# Patient Record
Sex: Male | Born: 1955
Health system: Southern US, Community
[De-identification: ages and names within clinical notes are randomized; demographics above are authoritative.]

## PROBLEM LIST (undated history)

## (undated) DIAGNOSIS — I10 Essential (primary) hypertension: Secondary | ICD-10-CM

## (undated) DIAGNOSIS — E785 Hyperlipidemia, unspecified: Secondary | ICD-10-CM

## (undated) DIAGNOSIS — I499 Cardiac arrhythmia, unspecified: Secondary | ICD-10-CM

## (undated) DIAGNOSIS — E119 Type 2 diabetes mellitus without complications: Secondary | ICD-10-CM

## (undated) HISTORY — PX: TONSILLECTOMY: SUR1361

## (undated) HISTORY — DX: Essential (primary) hypertension: I10

## (undated) HISTORY — PX: APPENDECTOMY: SHX54

## (undated) HISTORY — PX: HERNIA REPAIR: SHX51

## (undated) HISTORY — DX: Type 2 diabetes mellitus without complications: E11.9

## (undated) HISTORY — PX: KNEE SURGERY: SHX244

## (undated) HISTORY — DX: Hyperlipidemia, unspecified: E78.5

---

## 2004-12-03 ENCOUNTER — Emergency Department: Payer: Self-pay | Admitting: Emergency Medicine

## 2005-04-01 HISTORY — PX: COLONOSCOPY: SHX174

## 2007-06-17 ENCOUNTER — Ambulatory Visit: Payer: Self-pay | Admitting: Gastroenterology

## 2007-06-17 LAB — HM COLONOSCOPY

## 2009-06-05 ENCOUNTER — Ambulatory Visit: Payer: Self-pay | Admitting: Surgery

## 2011-08-04 ENCOUNTER — Ambulatory Visit: Payer: Self-pay

## 2011-08-04 LAB — CBC WITH DIFFERENTIAL/PLATELET
Basophil %: 0.9 %
Eosinophil %: 2.1 %
HCT: 46 % (ref 40.0–52.0)
HGB: 15.4 g/dL (ref 13.0–18.0)
Lymphocyte %: 42.6 %
MCHC: 33.5 g/dL (ref 32.0–36.0)
MCV: 97 fL (ref 80–100)
Neutrophil #: 3.5 10*3/uL (ref 1.4–6.5)
Neutrophil %: 44.9 %
RDW: 12.6 % (ref 11.5–14.5)

## 2011-08-04 LAB — SEDIMENTATION RATE: Erythrocyte Sed Rate: 3 mm/hr (ref 0–20)

## 2011-08-04 LAB — URIC ACID: Uric Acid: 6.1 mg/dL (ref 3.5–7.2)

## 2011-08-22 ENCOUNTER — Other Ambulatory Visit: Payer: Self-pay | Admitting: Podiatry

## 2011-08-22 LAB — SYNOVIAL FLUID, CRYSTAL: Crystals, Joint Fluid: NONE SEEN

## 2011-08-22 LAB — BODY FLUID CELL COUNT WITH DIFFERENTIAL
Basophil: 0 %
Eosinophil: 0 %
Lymphocytes: 33 %
Neutrophils: 24 %
Nucleated Cell Count: 21 /mm3
Other Cells BF: 14 %
Other Mononuclear Cells: 29 %

## 2011-10-18 ENCOUNTER — Observation Stay: Payer: Self-pay | Admitting: Internal Medicine

## 2011-10-18 LAB — URINALYSIS, COMPLETE
Glucose,UR: NEGATIVE mg/dL (ref 0–75)
Ketone: NEGATIVE
Leukocyte Esterase: NEGATIVE
Nitrite: NEGATIVE
Ph: 5 (ref 4.5–8.0)
Protein: NEGATIVE
RBC,UR: 6 /HPF (ref 0–5)
Specific Gravity: 1.018 (ref 1.003–1.030)
Squamous Epithelial: NONE SEEN
WBC UR: 1 /HPF (ref 0–5)

## 2011-10-18 LAB — COMPREHENSIVE METABOLIC PANEL
Albumin: 3.5 g/dL (ref 3.4–5.0)
Anion Gap: 10 (ref 7–16)
Bilirubin,Total: 1 mg/dL (ref 0.2–1.0)
Calcium, Total: 8.4 mg/dL — ABNORMAL LOW (ref 8.5–10.1)
Chloride: 109 mmol/L — ABNORMAL HIGH (ref 98–107)
Co2: 23 mmol/L (ref 21–32)
Creatinine: 0.85 mg/dL (ref 0.60–1.30)
EGFR (African American): >60
Glucose: 106 mg/dL — ABNORMAL HIGH (ref 65–99)
Osmolality: 285 (ref 275–301)
Potassium: 3.8 mmol/L (ref 3.5–5.1)
SGOT(AST): 23 U/L (ref 15–37)
Sodium: 142 mmol/L (ref 136–145)
Total Protein: 6.7 g/dL (ref 6.4–8.2)

## 2011-10-18 LAB — CBC
MCH: 30.8 pg (ref 26.0–34.0)
MCHC: 31.8 g/dL — ABNORMAL LOW (ref 32.0–36.0)
Platelet: 245 10*3/uL (ref 150–440)
RBC: 4.42 10*6/uL (ref 4.40–5.90)
RDW: 13.2 % (ref 11.5–14.5)

## 2011-10-18 LAB — LIPID PANEL
Ldl Cholesterol, Calc: 77 mg/dL (ref 0–100)
VLDL Cholesterol, Calc: 31 mg/dL (ref 5–40)

## 2011-10-19 DIAGNOSIS — G459 Transient cerebral ischemic attack, unspecified: Secondary | ICD-10-CM

## 2011-10-19 LAB — COMPREHENSIVE METABOLIC PANEL
Alkaline Phosphatase: 46 U/L — ABNORMAL LOW (ref 50–136)
Bilirubin,Total: 1.1 mg/dL — ABNORMAL HIGH (ref 0.2–1.0)
Calcium, Total: 8.5 mg/dL (ref 8.5–10.1)
Chloride: 110 mmol/L — ABNORMAL HIGH (ref 98–107)
Co2: 25 mmol/L (ref 21–32)
Creatinine: 0.81 mg/dL (ref 0.60–1.30)
EGFR (African American): >60
EGFR (Non-African Amer.): >60
Glucose: 104 mg/dL — ABNORMAL HIGH (ref 65–99)
Osmolality: 290 (ref 275–301)
Potassium: 3.7 mmol/L (ref 3.5–5.1)
SGOT(AST): 11 U/L — ABNORMAL LOW (ref 15–37)
Sodium: 145 mmol/L (ref 136–145)

## 2011-10-19 LAB — CK-MB: CK-MB: 3.2 ng/mL (ref 0.5–3.6)

## 2011-10-19 LAB — CBC WITH DIFFERENTIAL/PLATELET
Eosinophil %: 3.2 %
HCT: 41 % (ref 40.0–52.0)
HGB: 13.7 g/dL (ref 13.0–18.0)
Lymphocyte #: 3.3 10*3/uL (ref 1.0–3.6)
Lymphocyte %: 47.4 %
MCH: 32.1 pg (ref 26.0–34.0)
MCV: 96 fL (ref 80–100)
Monocyte %: 7.1 %
Neutrophil #: 2.8 10*3/uL (ref 1.4–6.5)
RBC: 4.26 10*6/uL — ABNORMAL LOW (ref 4.40–5.90)
RDW: 13 % (ref 11.5–14.5)
WBC: 7 10*3/uL (ref 3.8–10.6)

## 2011-10-19 LAB — TROPONIN I
Troponin-I: <0.02 ng/mL
Troponin-I: <0.02 ng/mL
Troponin-I: <0.02 ng/mL

## 2013-06-30 DIAGNOSIS — M1710 Unilateral primary osteoarthritis, unspecified knee: Secondary | ICD-10-CM | POA: Insufficient documentation

## 2013-07-05 ENCOUNTER — Ambulatory Visit: Payer: Self-pay

## 2014-04-21 LAB — HEPATIC FUNCTION PANEL
ALT: 28 U/L (ref 10–40)
AST: 22 U/L (ref 14–40)

## 2014-04-21 LAB — PSA: PSA: 0.4

## 2014-04-21 LAB — LIPID PANEL
CHOLESTEROL: 161 mg/dL (ref 0–200)
HDL: 44 mg/dL (ref 35–70)
LDL CALC: 91 mg/dL
Triglycerides: 128 mg/dL (ref 40–160)

## 2014-04-21 LAB — BASIC METABOLIC PANEL
BUN: 19 mg/dL (ref 4–21)
Creatinine: 1 mg/dL (ref <=1.3)
Glucose: 122 mg/dL

## 2014-04-21 LAB — FECAL OCCULT BLOOD, GUAIAC: Fecal Occult Blood: NEGATIVE

## 2014-05-19 DIAGNOSIS — M1712 Unilateral primary osteoarthritis, left knee: Secondary | ICD-10-CM | POA: Insufficient documentation

## 2014-05-25 DIAGNOSIS — E785 Hyperlipidemia, unspecified: Secondary | ICD-10-CM | POA: Insufficient documentation

## 2014-05-25 DIAGNOSIS — I10 Essential (primary) hypertension: Secondary | ICD-10-CM | POA: Insufficient documentation

## 2014-05-25 DIAGNOSIS — E669 Obesity, unspecified: Secondary | ICD-10-CM | POA: Insufficient documentation

## 2014-05-25 DIAGNOSIS — E1169 Type 2 diabetes mellitus with other specified complication: Secondary | ICD-10-CM | POA: Insufficient documentation

## 2014-05-25 DIAGNOSIS — R9439 Abnormal result of other cardiovascular function study: Secondary | ICD-10-CM | POA: Insufficient documentation

## 2014-05-25 DIAGNOSIS — E119 Type 2 diabetes mellitus without complications: Secondary | ICD-10-CM | POA: Insufficient documentation

## 2014-06-10 LAB — HEMOGLOBIN A1C: Hgb A1c MFr Bld: 7.4 % — AB (ref 4.0–6.0)

## 2014-07-01 DIAGNOSIS — Z96652 Presence of left artificial knee joint: Secondary | ICD-10-CM | POA: Insufficient documentation

## 2014-07-19 NOTE — H&P (Signed)
PATIENT NAME:  Darius Brown, HENDERSHOTT MR#:  [id number] DATE OF BIRTH:  22-Mar-1956  DATE OF ADMISSION:  10/18/2011  PRIMARY CARE PHYSICIAN: Dr. Otilio Miu.   CHIEF COMPLAINT: Right-sided numbness and tingling.   HISTORY OF PRESENT ILLNESS:  This is a morbidly obese 59 year old male with a history of hypertension, dyslipidemia, type 2 diabetes who presents to the ER with a chief complaint of sudden onset of right upper extremity numbness and tingling that started around 5:30 p.m. The patient had just driven back from [place] after a long meeting. The patient states that he was in his garage trying to prepare the meal for the dog when he started experiencing these symptoms. The symptom was associated with blurry vision and headache. The patient denied any slurred speech or unilateral weakness. He denied any difficulty with ambulation or gait instability. His symptoms lasted only about 2 to 3 minutes. The patient was brought in via EMS. He has never experienced anything like this before. There is no prior history of stroke or cerebrovascular accident. The patient states that his last hemoglobin A1c was 6.9. He has been compliant with his medications. His wife attributes the symptoms to a stressful job. He denies any chest pain or palpitations. He denies any orthopnea, paroxysmal nocturnal dyspnea, or dependent edema. Currently, has no gross focal neurologic deficits when he presented to the ER. The patient was found to be normotensive. He had a negative work-up, but is being admitted for observation. He does not take any aspirin. He denies any weight loss or weight gain. He denies any cervical spine pain or discomfort.   REVIEW OF SYSTEMS: CONSTITUTIONAL: Denies any fever, fatigue, weakness, pain, weight loss or weight gain. EYES: Does complain of blurry vision, but denies any pain, redness, inflammation or glaucoma. ENT: Denies any tinnitus, ear pain, hearing loss, or seasonal allergies. RESPIRATORY: Denies any  cough, wheezing, hemoptysis, or dyspnea. CARDIOVASCULAR: Denies any chest pain, orthopnea, edema, or arrhythmia. GASTROINTESTINAL: Denies any nausea, vomiting, abdominal pain, hematemesis, or melena.  GENITOURINARY: Denies any hematuria, dysuria, renal calculi, or frequency. ENDOCRINE: Denies any polyuria, nocturia, thyroid problems, increased sweating, heat or cold intolerance. HEMATOLOGIC: Denies any anemia, easy bruising, bleeding, or swollen glands. INTEGUMENTARY: Denies any acne, rash, or lesions. MUSCULOSKELETAL: Denies any neck, back pain, shoulder pain or knee pain. NEUROLOGICAL: Denies any numbness, weakness, dysarthria, epilepsy, tremor, vertigo, or ataxia. PSYCHIATRIC: Denies any anxiety, insomnia, bipolar disorder, or depression.   SOCIAL HISTORY: The patient quit smoking in 2008. Before that he used to smoke 2 to 3 cigarettes a day. He drinks occasionally. He works as a Librarian, academic.   FAMILY HISTORY: Positive for cerebrovascular accident in the mother and diabetes in the father. Father also had congestive heart failure and chronic obstructive pulmonary disease.   HOME MEDICATIONS:  1. Etodolac 1 tablet p.o. daily.  2. Januvia 1 tablet p.o. daily.  3. Lipitor 20 mg p.o. daily.  4. Lisinopril 5 mg p.o. daily.  5. Metformin 500 mg p.o. twice a day.   ALLERGIES: No known drug allergies.   PHYSICAL EXAMINATION:  VITAL SIGNS: Blood pressure 126/82, pulse 63, respirations 18, temperature 97.7.   GENERAL: Currently comfortable, alert, oriented x3. No acute cardiopulmonary distress.   HEENT: Pupils equal and reactive to light. Extraocular movements intact. Sclerae anicteric.   NECK: Supple. No JVD. No carotid bruits.   LUNGS: Clear to auscultation bilaterally. No wheezes, crackles, or rhonchi.   CARDIOVASCULAR: Regular rate and rhythm. No murmurs, rubs, or gallops.   ABDOMEN: Obese, soft,  nontender, nondistended, normoactive bowel sounds.   NEUROLOGIC: Cranial nerves II through XII  grossly intact. No gross focal neurologic deficits. Deep tendon reflexes 2+ bilaterally. Motor strength intact 5/5 in bilateral upper and lower extremities.   MUSCULOSKELETAL: Range of motion of the neck is intact. Strength intact in bilateral upper and lower extremities.   PSYCHIATRIC: Appropriate mood and affect.   LYMPHATIC: No axillary, inguinal, or cervical lymphadenopathy.   LABORATORY, DIAGNOSTIC AND RADIOLOGICAL DATA: CT shows no acute abnormality. WBC 8.1, hemoglobin 13.6, hematocrit 42.8, platelet count 245, BUN 17, creatinine 0.85, sodium 142, potassium 3.8, chloride 109, total bilirubin 1.0, ALT 31, AST 23. Urinalysis negative.   ASSESSMENT AND PLAN:  1. Possible transient ischemic attack.  2. Hypertension.  3. Diabetes type 2 uncontrolled.  4. Dyslipidemia.   PLAN: The patient will be admitted to the hospital for full transient ischemic attack/cerebrovascular accident work-up. Currently, he does not have any gross focal neurologic deficits, however, given his presentation the patient will have an MRI/MRA of the brain. We will start him on aspirin daily. He recently had a hemoglobin A1c checked and reportedly it was 6.9 a month ago. Therefore, this will not be repeated. We will check a lipid profile. We will also check a 2-D echo. Cycle cardiac enzymes and maintain him on telemetry. If his work-up negative, the patient can be discharged home in the morning.   TIME SPENT DOING THIS ADMISSION: 70 minutes.  ____________________________ Reyne Dumas, MD na:ap D: 10/18/2011 22:35:14 ET T: 10/19/2011 08:04:35 ET JOB#: [id number]  cc: Reyne Dumas, MD, <Dictator> Juline Patch, MD Reyne Dumas MD ELECTRONICALLY SIGNED 10/20/2011 23:40

## 2014-07-19 NOTE — Discharge Summary (Signed)
PATIENT NAME:  Darius Brown, Darius Brown MR#:  [id number] DATE OF BIRTH:  27-Jun-1955  DATE OF ADMISSION:  10/18/2011 DATE OF DISCHARGE:  10/19/2011  ADMITTING DIAGNOSIS: Transient ischemic attack.   DISCHARGE DIAGNOSES: 1. Transient ischemic attack with right arm as well as facial numbness and weakness. 2. Headache, resolved.  3. Hypertension.  4. Hyperlipidemia.  5. Diabetes mellitus.   DISCHARGE CONDITION: Stable.   DISCHARGE MEDICATIONS: Patient is to resume his outpatient medications which are: 1. Lisinopril 5 mg p.o. daily.  2. Metformin 500 mg p.o. twice daily. 3. Etodolac orally once daily.  4. Januvia unknown dose orally daily.   ADDITIONAL MEDICATIONS:  1. Aspirin 81 mg p.o. daily.  2. Lipitor 30 mg p.o. at bedtime.   NOTE: Patient should exert care taking etodolac as well as aspirin due to high risk of GI ulceration and possible bleeding. Discuss with his primary care physician about etodolac versus using Tylenol alone.   HOME OXYGEN: None.   DIET: 2 grams salt, low fat, low cholesterol, carbohydrate controlled diet, regular consistency.   ACTIVITY LIMITATIONS: As tolerated.   FOLLOW UP: Follow-up appointment with Dr. Otilio Miu in two days after discharge.   CONSULTANTS: None.   HISTORY OF PRESENT ILLNESS: Patient is a 59 year old Caucasian male with past medical history significant for history of hypertension, hyperlipidemia as well as diabetes mellitus on oral medications who presented on 10/18/2011 with complaints of right-sided numbness as well as tingling. Please refer to Dr. Ledell Peoples admission note on 10/18/2011. Apparently patient was having sudden onset of right upper extremity numbness as well as tingling. Symptoms were associated with blurry vision as well as headaches. He denied any slurred speech or difficulty with ambulation or gait instability. His symptoms lasted approximately 2 to 3 minutes. On arrival to the Emergency Room his symptoms resolved. He was  admitted for observation. On arrival to the Emergency Room patient's vital signs: Blood pressure 126/82, temperature 97.7, pulse 63, respiration rate 18. Physical examination was unremarkable except of obese man.   LABORATORY, DIAGNOSTIC AND RADIOLOGICAL DATA: CT of head without contrast 10/18/2011 showed no acute abnormality. Carotid ultrasound bilateral 10/19/2011 showed no hemodynamically significant stenosis. Echocardiogram, sinus bradycardia was noted. No source of cerebrovascular accident or transient ischemic attack noted. Left ventricular systolic function is low normal, ejection fraction of 50% to 55%, right ventricular systolic function is normal.  Right ventricular systolic pressure is normal.   His lab studies done on 10/18/2011 showed glucose 106, otherwise BMP was unremarkable. Patient's liver enzymes were unremarkable. Cardiac enzymes x3 were normal. CBC was within normal limits. Patient's urinalysis revealed yellow clear urine, negative for glucose, bilirubin or ketones, specific gravity 1.018, pH 5.0, protein 1+ blood, negative for protein, nitrites or leukocyte esterase, 6 red blood cells, 1 white blood cell, no bacteria or epithelial cells were noted.   HOSPITAL COURSE: Patient was admitted to the hospital for further evaluation. Because of concerns of transient ischemic attack he underwent carotid ultrasound as well as echocardiogram, both of those were unremarkable. He also had his lipid profile checked. LDL was found to be 77, cholesterol 145, triglycerides 153, HDL 37. It was felt that patient would benefit from advancement of his Lipitor with a goal LDL below 70. He is to start aspirin and continue for life. On the day of discharge, 10/19/2011, patient physically does not have any symptoms of stroke. His repeated labs were unremarkable and his studies came back normal. He was advised to lower his stress, continue to follow strict diet  as well as lipid management with  cholesterol-lowering medication, Lipitor, which was increased to 30 mg p.o. at bedtime daily dose. He was started on aspirin therapy daily.   In regards to his and headache, it also resolved. It was unclear if it was at all associated with TIA or was separate event entity.  For hypertension patient's blood pressure was well controlled on current medications. He is to continue those medications.   For diabetes mellitus, patient's hemoglobin A1c reportedly was 6.9. He is to continue his outpatient medications.  He is being discharged in stable condition with above-mentioned medications and follow up.   TIME SPENT: 40 minutes.   ____________________________ Theodoro Grist, MD rv:cms D: 10/19/2011 18:37:53 ET T: 10/21/2011 11:10:49 ET JOB#: [id number]  cc: Theodoro Grist, MD, <Dictator> Juline Patch, MD Dr. Varney Daily MD ELECTRONICALLY SIGNED 10/23/2011 16:24

## 2014-08-23 DIAGNOSIS — M26609 Unspecified temporomandibular joint disorder, unspecified side: Secondary | ICD-10-CM | POA: Insufficient documentation

## 2014-08-23 DIAGNOSIS — Z96651 Presence of right artificial knee joint: Secondary | ICD-10-CM

## 2014-08-23 DIAGNOSIS — I639 Cerebral infarction, unspecified: Secondary | ICD-10-CM | POA: Insufficient documentation

## 2014-08-23 DIAGNOSIS — Z471 Aftercare following joint replacement surgery: Secondary | ICD-10-CM | POA: Insufficient documentation

## 2014-08-23 DIAGNOSIS — E1165 Type 2 diabetes mellitus with hyperglycemia: Secondary | ICD-10-CM | POA: Insufficient documentation

## 2014-08-23 DIAGNOSIS — I10 Essential (primary) hypertension: Secondary | ICD-10-CM | POA: Insufficient documentation

## 2014-08-23 DIAGNOSIS — E7849 Other hyperlipidemia: Secondary | ICD-10-CM | POA: Insufficient documentation

## 2014-08-23 DIAGNOSIS — IMO0002 Reserved for concepts with insufficient information to code with codable children: Secondary | ICD-10-CM | POA: Insufficient documentation

## 2014-08-23 DIAGNOSIS — M19079 Primary osteoarthritis, unspecified ankle and foot: Secondary | ICD-10-CM | POA: Insufficient documentation

## 2015-02-08 ENCOUNTER — Other Ambulatory Visit: Payer: Self-pay

## 2015-02-08 DIAGNOSIS — E119 Type 2 diabetes mellitus without complications: Secondary | ICD-10-CM

## 2015-02-08 MED ORDER — SAXAGLIPTIN HCL 5 MG PO TABS: 5.0000 mg | ORAL_TABLET | Freq: Every day | ORAL | Status: DC

## 2015-03-07 ENCOUNTER — Ambulatory Visit: Payer: Self-pay | Admitting: Family Medicine

## 2015-03-14 ENCOUNTER — Encounter: Payer: Self-pay | Admitting: Family Medicine

## 2015-03-14 ENCOUNTER — Ambulatory Visit (INDEPENDENT_AMBULATORY_CARE_PROVIDER_SITE_OTHER): Payer: Commercial Indemnity | Admitting: Family Medicine

## 2015-03-14 VITALS — BP 120/64 | HR 80 | Ht 71.0 in | Wt 258.0 lb

## 2015-03-14 DIAGNOSIS — E119 Type 2 diabetes mellitus without complications: Secondary | ICD-10-CM

## 2015-03-14 DIAGNOSIS — I10 Essential (primary) hypertension: Secondary | ICD-10-CM | POA: Diagnosis not present

## 2015-03-14 DIAGNOSIS — E785 Hyperlipidemia, unspecified: Secondary | ICD-10-CM

## 2015-03-14 MED ORDER — SAXAGLIPTIN HCL 5 MG PO TABS: 5.0000 mg | ORAL_TABLET | Freq: Every day | ORAL | Status: DC

## 2015-03-14 MED ORDER — ATORVASTATIN CALCIUM 20 MG PO TABS: 20.0000 mg | ORAL_TABLET | Freq: Every day | ORAL | Status: DC

## 2015-03-14 MED ORDER — LISINOPRIL 5 MG PO TABS: 5.0000 mg | ORAL_TABLET | Freq: Every day | ORAL | Status: DC

## 2015-03-14 MED ORDER — METFORMIN HCL 1000 MG PO TABS: 1000.0000 mg | ORAL_TABLET | Freq: Two times a day (BID) | ORAL | Status: DC

## 2015-03-14 NOTE — Patient Instructions (Signed)
Diabetic Retinopathy Diabetic retinopathy is a disease of the light-sensitive membrane at the back of the eye (retina). It is a complication of diabetes and a common cause of blindness. Early detection of the disease is key to keeping your eyes healthy.  CAUSES  Diabetic retinopathy is caused by blood sugar (glucose) levels that are too high over an extended period of time. High blood sugars cause damage to the small blood vessels of the retina, allowing blood to leak through the vessel walls. This causes visual impairment and eventually blindness. RISK FACTORS  High blood pressure.  Having diabetes for a long time.  Having poorly controlled blood sugars. SIGNS AND SYMPTOMS  In the early stages of diabetic retinopathy, there are often no symptoms. As the condition advances, symptoms may include:  Blurred vision. This is usually caused by a swelling due to abnormal blood glucose levels. The blurriness may go away when blood glucose levels return to normal.  Moving specks or dark spots (floaters) in your vision. These can be caused by a small retinal hemorrhage. A hemorrhage is bleeding from blood vessels.  Missing parts of your field of vision, such as things at the side. This can be caused by larger retinal hemorrhages.  Difficulty reading books or signs.  Double vision.  Pain in one or both eyes.  Feeling pressure in one or both eyes.  Trouble seeing straight lines. Straight lines do not look straight.  Redness of the eyes that does not go away. DIAGNOSIS  Your eye care specialist can detect changes in the blood vessels of your eye by putting drops in your eyes that enlarge (dilate) your pupils. This allows your eye care specialist to get a good look at your retina to see if there are any changes that have occurred as a result of your diabetes. You should have your eyes examined once a year. TREATMENT  Your eye care specialist may use a special laser beam to seal the blood vessels  of the retina and stop them from leaking. Early detection and treatment are important so that further damage to your eyes can be prevented. In addition, managing your blood sugars and keeping them in the target range can slow the progress of the disease. HOME CARE INSTRUCTIONS   Keep your blood pressure within your target range.  Keep your blood glucose levels within your target range.  Follow your health care provider's instructions regarding diet and other means for controlling your blood glucose levels.  Check your blood levels for glucose as recommended by your health care provider.  Keep regular appointments with your eye specialist. An eye specialist can usually see diabetic retinopathy developing long before it starts causing problems. In many cases, it can be treated to prevent complications from occurring. If you have diabetes, you should have your eyes checked at least every year. Your risk of retinopathy increases the longer you have the disease.  If you smoke, quit. Ask your health care provider for help if needed. Smoking can make retinopathy worse. SEEK MEDICAL CARE IF:   You notice gradual blurring or other changes in your vision over time.  You notice that your glasses or contact lenses do not make things look as sharp as they once did.  You have trouble reading or seeing details at a distance with either eye.  You notice a sudden change in your vision or notice that parts of your field of vision appear missing or hazy.  You suddenly see moving specks or dark spots   in the field of vision of either eye.  You have sudden partial loss of vision in either eye.   This information is not intended to replace advice given to you by your health care provider. Make sure you discuss any questions you have with your health care provider.   Document Released: 03/15/2000 Document Revised: 01/06/2013 Document Reviewed: 09/07/2012 Elsevier Interactive Patient Education 2016 [employer]  Inc[employer]  

## 2015-03-14 NOTE — Progress Notes (Signed)
Name: Darius Brown.   MRN: [id number]    DOB: [birth year]   Date:03/14/2015       Progress Note  Subjective  Chief Complaint  Chief Complaint  Patient presents with  . Hypertension  . Hyperlipidemia  . Diabetes    Hypertension This is a chronic problem. The current episode started more than 1 year ago. The problem has been gradually improving since onset. The problem is controlled. Pertinent negatives include no anxiety, blurred vision, chest pain, headaches, malaise/fatigue, neck pain, orthopnea, palpitations, peripheral edema, PND, shortness of breath or sweats. There are no associated agents to hypertension. Risk factors for coronary artery disease include dyslipidemia, diabetes mellitus, obesity and male gender. Past treatments include ACE inhibitors. The current treatment provides moderate improvement. There are no compliance problems.  There is no history of angina, kidney disease, CAD/MI, CVA, heart failure, left ventricular hypertrophy, PVD, renovascular disease or retinopathy. There is no history of chronic renal disease or a hypertension causing med.  Hyperlipidemia This is a chronic problem. The problem is controlled. Recent lipid tests were reviewed and are normal. Exacerbating diseases include diabetes and obesity. He has no history of chronic renal disease, hypothyroidism, liver disease or nephrotic syndrome. There are no known factors aggravating his hyperlipidemia. Pertinent negatives include no chest pain, focal sensory loss, focal weakness, leg pain, myalgias or shortness of breath. Current antihyperlipidemic treatment includes statins. The current treatment provides moderate improvement of lipids. There are no compliance problems.  Risk factors for coronary artery disease include diabetes mellitus, dyslipidemia, hypertension, male sex and obesity.  Diabetes He presents for his follow-up diabetic visit. He has type 2 diabetes mellitus. His disease course has been stable.  Pertinent negatives for hypoglycemia include no confusion, dizziness, headaches, hunger, mood changes, nervousness/anxiousness, pallor, seizures, sleepiness, speech difficulty, sweats or tremors. Pertinent negatives for diabetes include no blurred vision, no chest pain, no fatigue, no foot paresthesias, no foot ulcerations, no polydipsia, no polyphagia, no polyuria, no visual change, no weakness and no weight loss. There are no hypoglycemic complications. Symptoms are stable. There are no diabetic complications. Pertinent negatives for diabetic complications include no CVA, PVD or retinopathy. Current diabetic treatment includes oral agent (dual therapy). He is following a generally healthy diet. He has not had a previous visit with a dietitian. He participates in exercise daily (walks dog). His home blood glucose trend is fluctuating minimally. His breakfast blood glucose is taken between 7-8 am. His breakfast blood glucose range is generally 90-110 mg/dl. An ACE inhibitor/angiotensin II receptor blocker is being taken. He does not see a podiatrist.Eye exam is current.    No problem-specific assessment & plan notes found for this encounter.   Past Medical History  Diagnosis Date  . Hypertension   . Hyperlipidemia   . Diabetes mellitus without complication [facility])     Past Surgical History  Procedure Laterality Date  . Appendectomy    . Hernia repair      x 2  . Knee surgery Right   . Tonsillectomy    . Knee surgery Left   . Colonoscopy  2007    repeat in 10 years    History reviewed. No pertinent family history.  Social History   Social History  . Marital Status: Married    Spouse Name: N/A  . Number of Children: N/A  . Years of Education: N/A   Occupational History  . Not on file.   Social History Main Topics  . Smoking status: Former Research scientist (life sciences)  .  Smokeless tobacco: Not on file  . Alcohol Use: 0.0 oz/week    0 Standard drinks or equivalent per week  . Drug Use: No  . Sexual  Activity: Yes   Other Topics Concern  . Not on file   Social History Narrative    No Known Allergies   Review of Systems  Constitutional: Negative for fever, chills, weight loss, malaise/fatigue and fatigue.  HENT: Negative for ear discharge, ear pain and sore throat.   Eyes: Negative for blurred vision.  Respiratory: Negative for cough, sputum production, shortness of breath and wheezing.   Cardiovascular: Negative for chest pain, palpitations, orthopnea, leg swelling and PND.  Gastrointestinal: Negative for heartburn, nausea, abdominal pain, diarrhea, constipation, blood in stool and melena.  Genitourinary: Negative for dysuria, urgency, frequency and hematuria.  Musculoskeletal: Negative for myalgias, back pain, joint pain and neck pain.  Skin: Negative for pallor and rash.  Neurological: Negative for dizziness, tingling, tremors, sensory change, focal weakness, seizures, speech difficulty, weakness and headaches.  Endo/Heme/Allergies: Negative for environmental allergies, polydipsia and polyphagia. Does not bruise/bleed easily.  Psychiatric/Behavioral: Negative for depression, suicidal ideas and confusion. The patient is not nervous/anxious and does not have insomnia.      Objective  Filed Vitals:   03/14/15 0804  BP: 120/64  Pulse: 80  Height: 5\' 11"  (1.803 m)  Weight: 258 lb (117.028 kg)    Physical Exam  Constitutional: He is oriented to person, place, and time and well-developed, well-nourished, and in no distress.  HENT:  Head: Normocephalic.  Right Ear: External ear normal.  Left Ear: External ear normal.  Nose: Nose normal.  Mouth/Throat: Oropharynx is clear and moist.  Eyes: Conjunctivae and EOM are normal. Pupils are equal, round, and reactive to light. Right eye exhibits no discharge. Left eye exhibits no discharge. No scleral icterus.  Neck: Normal range of motion. Neck supple. No JVD present. No tracheal deviation present. No thyromegaly present.   Cardiovascular: Normal rate, regular rhythm, normal heart sounds and intact distal pulses.  Exam reveals no gallop and no friction rub.   No murmur heard. Pulmonary/Chest: Breath sounds normal. No respiratory distress. He has no wheezes. He has no rales. He exhibits no tenderness.  Abdominal: Soft. Bowel sounds are normal. He exhibits no mass. There is no hepatosplenomegaly. There is no tenderness. There is no rebound, no guarding and no CVA tenderness.  Musculoskeletal: Normal range of motion. He exhibits no edema or tenderness.  Lymphadenopathy:    He has no cervical adenopathy.  Neurological: He is alert and oriented to person, place, and time. He has normal sensation, normal strength, normal reflexes and intact cranial nerves. No cranial nerve deficit.  Skin: Skin is warm. No rash noted.  Psychiatric: Mood and affect normal.  Nursing note and vitals reviewed.     Assessment & Plan  Problem List Items Addressed This Visit      Cardiovascular and Mediastinum   Essential (primary) hypertension   Relevant Medications   aspirin EC 81 MG tablet   lisinopril (PRINIVIL,ZESTRIL) 5 MG tablet   atorvastatin (LIPITOR) 20 MG tablet   Other Relevant Orders   Renal Function Panel     Endocrine   Type 2 diabetes mellitus (HCC) - Primary   Relevant Medications   aspirin EC 81 MG tablet   metFORMIN (GLUCOPHAGE) 1000 MG tablet   lisinopril (PRINIVIL,ZESTRIL) 5 MG tablet   saxagliptin HCl (ONGLYZA) 5 MG TABS tablet   atorvastatin (LIPITOR) 20 MG tablet   Other Relevant Orders  HgB A1c   Urine Microalbumin w/creat. ratio     Other   HLD (hyperlipidemia)   Relevant Medications   aspirin EC 81 MG tablet   lisinopril (PRINIVIL,ZESTRIL) 5 MG tablet   atorvastatin (LIPITOR) 20 MG tablet   Other Relevant Orders   Lipid Profile        Dr. Otilio Miu [facility]  03/14/2015

## 2015-03-15 LAB — LIPID PANEL
Chol/HDL Ratio: 3.6 ratio units (ref 0.0–5.0)
Cholesterol, Total: 144 mg/dL (ref 100–199)
HDL: 40 mg/dL (ref >39)
LDL CALC: 78 mg/dL (ref 0–99)
Triglycerides: 130 mg/dL (ref 0–149)
VLDL CHOLESTEROL CAL: 26 mg/dL (ref 5–40)

## 2015-03-15 LAB — MICROALBUMIN / CREATININE URINE RATIO
Creatinine, Urine: 57.7 mg/dL
MICROALB/CREAT RATIO: <5.2 mg/g creat (ref 0.0–30.0)
Microalbumin, Urine: <3 ug/mL

## 2015-03-15 LAB — HEMOGLOBIN A1C
ESTIMATED AVERAGE GLUCOSE: 177 mg/dL
Hgb A1c MFr Bld: 7.8 % — ABNORMAL HIGH (ref 4.8–5.6)

## 2015-03-15 LAB — RENAL FUNCTION PANEL
ALBUMIN: 4.3 g/dL (ref 3.5–5.5)
BUN / CREAT RATIO: 18 (ref 9–20)
BUN: 16 mg/dL (ref 6–24)
CHLORIDE: 99 mmol/L (ref 96–106)
CO2: 23 mmol/L (ref 18–29)
Calcium: 9.3 mg/dL (ref 8.7–10.2)
Creatinine, Ser: 0.89 mg/dL (ref 0.76–1.27)
GFR calc non Af Amer: 94 mL/min/{1.73_m2} (ref >59)
GFR, EST AFRICAN AMERICAN: 108 mL/min/{1.73_m2} (ref >59)
GLUCOSE: 135 mg/dL — AB (ref 65–99)
POTASSIUM: 5 mmol/L (ref 3.5–5.2)
Phosphorus: 3.4 mg/dL (ref 2.5–4.5)
Sodium: 137 mmol/L (ref 134–144)

## 2015-03-16 ENCOUNTER — Other Ambulatory Visit: Payer: Self-pay

## 2015-03-16 DIAGNOSIS — E119 Type 2 diabetes mellitus without complications: Secondary | ICD-10-CM

## 2015-04-11 ENCOUNTER — Other Ambulatory Visit: Payer: Self-pay

## 2016-01-26 DIAGNOSIS — E119 Type 2 diabetes mellitus without complications: Secondary | ICD-10-CM | POA: Diagnosis not present

## 2016-02-02 DIAGNOSIS — E119 Type 2 diabetes mellitus without complications: Secondary | ICD-10-CM | POA: Diagnosis not present

## 2016-02-02 DIAGNOSIS — E781 Pure hyperglyceridemia: Secondary | ICD-10-CM | POA: Diagnosis not present

## 2016-02-02 DIAGNOSIS — E669 Obesity, unspecified: Secondary | ICD-10-CM | POA: Diagnosis not present

## 2016-03-15 ENCOUNTER — Other Ambulatory Visit: Payer: Self-pay

## 2016-03-21 ENCOUNTER — Encounter: Payer: Self-pay | Admitting: Family Medicine

## 2016-03-21 ENCOUNTER — Ambulatory Visit (INDEPENDENT_AMBULATORY_CARE_PROVIDER_SITE_OTHER): Payer: Commercial Indemnity | Admitting: Family Medicine

## 2016-03-21 VITALS — BP 120/70 | HR 64 | Ht 71.0 in | Wt 250.0 lb

## 2016-03-21 DIAGNOSIS — E784 Other hyperlipidemia: Secondary | ICD-10-CM | POA: Diagnosis not present

## 2016-03-21 DIAGNOSIS — E782 Mixed hyperlipidemia: Secondary | ICD-10-CM | POA: Diagnosis not present

## 2016-03-21 DIAGNOSIS — E7849 Other hyperlipidemia: Secondary | ICD-10-CM

## 2016-03-21 DIAGNOSIS — Z1211 Encounter for screening for malignant neoplasm of colon: Secondary | ICD-10-CM

## 2016-03-21 DIAGNOSIS — I10 Essential (primary) hypertension: Secondary | ICD-10-CM

## 2016-03-21 LAB — HEMOCCULT GUIAC POC 1CARD (OFFICE): FECAL OCCULT BLD: NEGATIVE

## 2016-03-21 MED ORDER — LISINOPRIL 5 MG PO TABS: 5.0000 mg | ORAL_TABLET | Freq: Every day | ORAL | 1 refills | Status: DC

## 2016-03-21 MED ORDER — ATORVASTATIN CALCIUM 20 MG PO TABS: 20.0000 mg | ORAL_TABLET | Freq: Every day | ORAL | 1 refills | Status: DC

## 2016-03-21 NOTE — Progress Notes (Signed)
Name: Darius Brown.   MRN: [id number]    DOB: [birth year]   Date:03/21/2016       Progress Note  Subjective  Chief Complaint  Chief Complaint  Patient presents with  . Hypertension  . Hyperlipidemia    Hypertension  This is a chronic problem. The current episode started more than 1 year ago. The problem has been gradually improving since onset. The problem is controlled. Pertinent negatives include no anxiety, blurred vision, chest pain, headaches, malaise/fatigue, neck pain, orthopnea, palpitations, peripheral edema, PND, shortness of breath or sweats. There are no associated agents to hypertension. Risk factors for coronary artery disease include dyslipidemia, male gender and obesity. Past treatments include ACE inhibitors. The current treatment provides moderate improvement. There are no compliance problems.  There is no history of angina, kidney disease, CAD/MI, CVA, heart failure, left ventricular hypertrophy, PVD, renovascular disease or retinopathy. There is no history of chronic renal disease or a hypertension causing med.  Hyperlipidemia  This is a chronic problem. The current episode started more than 1 year ago. The problem is controlled. Recent lipid tests were reviewed and are normal. He has no history of chronic renal disease. There are no known factors aggravating his hyperlipidemia. Pertinent negatives include no chest pain, focal weakness, leg pain, myalgias or shortness of breath. He is currently on no antihyperlipidemic treatment. The current treatment provides moderate improvement of lipids. There are no compliance problems.     No problem-specific Assessment & Plan notes found for this encounter.   Past Medical History:  Diagnosis Date  . Diabetes mellitus without complication ([place])   . Hyperlipidemia   . Hypertension     Past Surgical History:  Procedure Laterality Date  . APPENDECTOMY    . COLONOSCOPY  2007   repeat in 10 years  . HERNIA REPAIR     x 2   . KNEE SURGERY Right   . KNEE SURGERY Left   . TONSILLECTOMY      History reviewed. No pertinent family history.  Social History   Social History  . Marital status: Married    Spouse name: N/A  . Number of children: N/A  . Years of education: N/A   Occupational History  . Not on file.   Social History Main Topics  . Smoking status: Former Research scientist (life sciences)  . Smokeless tobacco: Never Used  . Alcohol use 0.0 oz/week  . Drug use: No  . Sexual activity: Yes   Other Topics Concern  . Not on file   Social History Narrative  . No narrative on file    No Known Allergies   Review of Systems  Constitutional: Negative for chills, fever, malaise/fatigue and weight loss.  HENT: Negative for ear discharge, ear pain and sore throat.   Eyes: Negative for blurred vision.  Respiratory: Negative for cough, sputum production, shortness of breath and wheezing.   Cardiovascular: Negative for chest pain, palpitations, orthopnea, leg swelling and PND.  Gastrointestinal: Negative for abdominal pain, blood in stool, constipation, diarrhea, heartburn, melena and nausea.  Genitourinary: Negative for dysuria, frequency, hematuria and urgency.  Musculoskeletal: Negative for back pain, joint pain, myalgias and neck pain.  Skin: Negative for rash.  Neurological: Negative for dizziness, tingling, sensory change, focal weakness and headaches.  Endo/Heme/Allergies: Negative for environmental allergies and polydipsia. Does not bruise/bleed easily.  Psychiatric/Behavioral: Negative for depression and suicidal ideas. The patient is not nervous/anxious and does not have insomnia.      Objective  Vitals:   03/21/16 0801  BP: 120/70  Pulse: 64  Weight: 250 lb (113.4 kg)  Height: 5\' 11"  (1.803 m)    Physical Exam  Constitutional: He is oriented to person, place, and time and well-developed, well-nourished, and in no distress.  HENT:  Head: Normocephalic.  Right Ear: Tympanic membrane, external ear and  ear canal normal.  Left Ear: Tympanic membrane, external ear and ear canal normal.  Nose: Nose normal.  Mouth/Throat: Oropharynx is clear and moist.  Eyes: Conjunctivae and EOM are normal. Pupils are equal, round, and reactive to light. Right eye exhibits no discharge. Left eye exhibits no discharge. No scleral icterus.  Neck: Normal range of motion. Neck supple. No JVD present. No tracheal deviation present. No thyromegaly present.  Cardiovascular: Normal rate, regular rhythm, normal heart sounds and intact distal pulses.  Exam reveals no gallop and no friction rub.   No murmur heard. Pulmonary/Chest: Breath sounds normal. No respiratory distress. He has no wheezes. He has no rales.  Abdominal: Soft. Bowel sounds are normal. He exhibits no mass. There is no hepatosplenomegaly. There is no tenderness. There is no rebound, no guarding and no CVA tenderness.  Genitourinary: Rectum normal and prostate normal. Rectal exam shows guaiac negative stool.  Musculoskeletal: Normal range of motion. He exhibits no edema or tenderness.  Lymphadenopathy:    He has no cervical adenopathy.  Neurological: He is alert and oriented to person, place, and time. He has normal sensation, normal strength, normal reflexes and intact cranial nerves. No cranial nerve deficit.  Skin: Skin is warm. No rash noted.  Psychiatric: Mood and affect normal.  Nursing note and vitals reviewed.     Assessment & Plan  Problem List Items Addressed This Visit      Cardiovascular and Mediastinum   Essential (primary) hypertension - Primary   Relevant Medications   atorvastatin (LIPITOR) 20 MG tablet   lisinopril (PRINIVIL,ZESTRIL) 5 MG tablet   Other Relevant Orders   Renal Function Panel     Other   Familial multiple lipoprotein-type hyperlipidemia   Relevant Medications   atorvastatin (LIPITOR) 20 MG tablet   lisinopril (PRINIVIL,ZESTRIL) 5 MG tablet   Other Relevant Orders   Lipid Profile   HLD (hyperlipidemia)    Relevant Medications   atorvastatin (LIPITOR) 20 MG tablet   lisinopril (PRINIVIL,ZESTRIL) 5 MG tablet    Other Visit Diagnoses    Colon cancer screening       Relevant Orders   POCT Occult Blood Stool (Completed)   Ambulatory referral to Gastroenterology        Dr. Otilio Miu [facility]  03/21/16

## 2016-03-22 LAB — RENAL FUNCTION PANEL
Albumin: 4.5 g/dL (ref 3.6–4.8)
BUN / CREAT RATIO: 17 (ref 10–24)
BUN: 16 mg/dL (ref 8–27)
CALCIUM: 9.5 mg/dL (ref 8.6–10.2)
CO2: 20 mmol/L (ref 18–29)
CREATININE: 0.92 mg/dL (ref 0.76–1.27)
Chloride: 100 mmol/L (ref 96–106)
GFR calc Af Amer: 104 mL/min/{1.73_m2} (ref >59)
GFR calc non Af Amer: 90 mL/min/{1.73_m2} (ref >59)
GLUCOSE: 126 mg/dL — AB (ref 65–99)
POTASSIUM: 5.2 mmol/L (ref 3.5–5.2)
Phosphorus: 3.9 mg/dL (ref 2.5–4.5)
SODIUM: 140 mmol/L (ref 134–144)

## 2016-03-22 LAB — LIPID PANEL
CHOL/HDL RATIO: 3.5 ratio (ref 0.0–5.0)
CHOLESTEROL TOTAL: 140 mg/dL (ref 100–199)
HDL: 40 mg/dL (ref >39)
LDL CALC: 76 mg/dL (ref 0–99)
Triglycerides: 120 mg/dL (ref 0–149)
VLDL Cholesterol Cal: 24 mg/dL (ref 5–40)

## 2016-03-27 ENCOUNTER — Other Ambulatory Visit: Payer: Self-pay

## 2016-03-27 DIAGNOSIS — E782 Mixed hyperlipidemia: Secondary | ICD-10-CM

## 2016-03-27 DIAGNOSIS — I10 Essential (primary) hypertension: Secondary | ICD-10-CM

## 2016-03-27 MED ORDER — ATORVASTATIN CALCIUM 20 MG PO TABS: 20.0000 mg | ORAL_TABLET | Freq: Every day | ORAL | 1 refills | Status: DC

## 2016-03-27 MED ORDER — LISINOPRIL 5 MG PO TABS: 5.0000 mg | ORAL_TABLET | Freq: Every day | ORAL | 1 refills | Status: DC

## 2016-04-08 ENCOUNTER — Other Ambulatory Visit: Payer: Self-pay

## 2016-05-07 DIAGNOSIS — E119 Type 2 diabetes mellitus without complications: Secondary | ICD-10-CM | POA: Diagnosis not present

## 2016-05-10 ENCOUNTER — Telehealth: Payer: Self-pay

## 2016-05-10 DIAGNOSIS — E119 Type 2 diabetes mellitus without complications: Secondary | ICD-10-CM | POA: Diagnosis not present

## 2016-05-10 DIAGNOSIS — E781 Pure hyperglyceridemia: Secondary | ICD-10-CM | POA: Diagnosis not present

## 2016-05-10 NOTE — Telephone Encounter (Signed)
Gastroenterology Pre-Procedure Review  Request Date:  Requesting Physician: Dr.   PATIENT REVIEW QUESTIONS: The patient responded to the following health history questions as indicated:    1. Are you having any GI issues? no 2. Do you have a personal history of Polyps? no 3. Do you have a family history of Colon Cancer or Polyps? no 4. Diabetes Mellitus? no 5. Joint replacements in the past 12 months?no 6. Major health problems in the past 3 months?no 7. Any artificial heart valves, MVP, or defibrillator?no    MEDICATIONS & ALLERGIES:    Patient reports the following regarding taking any anticoagulation/antiplatelet therapy:   Plavix, Coumadin, Eliquis, Xarelto, Lovenox, Pradaxa, Brilinta, or Effient? no Aspirin? no  Patient confirms/reports the following medications:  Current Outpatient Prescriptions  Medication Sig Dispense Refill  . aspirin EC 81 MG tablet Take 1 tablet by mouth daily.    Marland Kitchen atorvastatin (LIPITOR) 20 MG tablet Take 1 tablet (20 mg total) by mouth daily. 90 tablet 1  . empagliflozin (JARDIANCE) 10 MG TABS tablet Take 10 mg by mouth daily. Dr Maretta Bees    . liraglutide (VICTOZA) 18 MG/3ML SOPN Inject 18 mg into the skin daily. Dr Maretta Bees    . lisinopril (PRINIVIL,ZESTRIL) 5 MG tablet Take 1 tablet (5 mg total) by mouth daily. 90 tablet 1  . metFORMIN (GLUCOPHAGE) 1000 MG tablet Take 1 tablet (1,000 mg total) by mouth 2 (two) times daily. (Patient taking differently: Take 1,000 mg by mouth 2 (two) times daily. Dr Maretta Bees) 180 tablet 1  . Omega-3 1000 MG CAPS Take 2 capsules by mouth 2 (two) times daily.     No current facility-administered medications for this visit.     Patient confirms/reports the following allergies:  No Known Allergies  No orders of the defined types were placed in this encounter.   AUTHORIZATION INFORMATION Primary Insurance: 1D#: Group #:  Secondary Insurance: 1D#: Group #:  SCHEDULE INFORMATION: Date: Will call back after speaking with  Wife.  Time: Location: [place]

## 2016-06-10 ENCOUNTER — Telehealth: Payer: Self-pay | Admitting: Gastroenterology

## 2016-06-10 NOTE — Telephone Encounter (Signed)
Patient left a voice message that he needs to schedule a colonoscopy. Please call [phone]

## 2016-06-11 ENCOUNTER — Telehealth: Payer: Self-pay | Admitting: Gastroenterology

## 2016-06-11 NOTE — Telephone Encounter (Signed)
Patient is returning your call regarding a colonoscopy °

## 2016-06-12 ENCOUNTER — Other Ambulatory Visit: Payer: Self-pay

## 2016-06-12 DIAGNOSIS — Z1211 Encounter for screening for malignant neoplasm of colon: Secondary | ICD-10-CM

## 2016-06-24 ENCOUNTER — Encounter: Payer: Self-pay | Admitting: *Deleted

## 2016-06-27 ENCOUNTER — Ambulatory Visit
Admission: RE | Admit: 2016-06-27 | Discharge: 2016-06-27 | Disposition: A | Discharge status: Home / Self Care | Payer: Commercial Indemnity | Source: Ambulatory Visit | Attending: Gastroenterology | Admitting: Gastroenterology

## 2016-06-27 ENCOUNTER — Ambulatory Visit: Payer: Commercial Indemnity | Admitting: Anesthesiology

## 2016-06-27 ENCOUNTER — Encounter
Admission: RE | Disposition: A | Discharge status: Home / Self Care | Payer: Self-pay | Source: Ambulatory Visit | Attending: Gastroenterology

## 2016-06-27 DIAGNOSIS — D122 Benign neoplasm of ascending colon: Secondary | ICD-10-CM

## 2016-06-27 DIAGNOSIS — Z1211 Encounter for screening for malignant neoplasm of colon: Secondary | ICD-10-CM | POA: Diagnosis not present

## 2016-06-27 DIAGNOSIS — D124 Benign neoplasm of descending colon: Secondary | ICD-10-CM | POA: Diagnosis not present

## 2016-06-27 DIAGNOSIS — E785 Hyperlipidemia, unspecified: Secondary | ICD-10-CM | POA: Insufficient documentation

## 2016-06-27 DIAGNOSIS — Z79899 Other long term (current) drug therapy: Secondary | ICD-10-CM | POA: Insufficient documentation

## 2016-06-27 DIAGNOSIS — Z7982 Long term (current) use of aspirin: Secondary | ICD-10-CM | POA: Diagnosis not present

## 2016-06-27 DIAGNOSIS — I10 Essential (primary) hypertension: Secondary | ICD-10-CM | POA: Diagnosis not present

## 2016-06-27 DIAGNOSIS — K641 Second degree hemorrhoids: Secondary | ICD-10-CM | POA: Insufficient documentation

## 2016-06-27 DIAGNOSIS — Z87891 Personal history of nicotine dependence: Secondary | ICD-10-CM | POA: Diagnosis not present

## 2016-06-27 DIAGNOSIS — E119 Type 2 diabetes mellitus without complications: Secondary | ICD-10-CM | POA: Insufficient documentation

## 2016-06-27 DIAGNOSIS — K5730 Diverticulosis of large intestine without perforation or abscess without bleeding: Secondary | ICD-10-CM | POA: Diagnosis not present

## 2016-06-27 DIAGNOSIS — Z794 Long term (current) use of insulin: Secondary | ICD-10-CM | POA: Diagnosis not present

## 2016-06-27 HISTORY — PX: POLYPECTOMY: SHX5525

## 2016-06-27 HISTORY — PX: COLONOSCOPY WITH PROPOFOL: SHX5780

## 2016-06-27 LAB — GLUCOSE, CAPILLARY
GLUCOSE-CAPILLARY: 144 mg/dL — AB (ref 65–99)
Glucose-Capillary: 151 mg/dL — ABNORMAL HIGH (ref 65–99)

## 2016-06-27 SURGERY — COLONOSCOPY WITH PROPOFOL
Anesthesia: Monitor Anesthesia Care

## 2016-06-27 MED ORDER — LIDOCAINE HCL (CARDIAC) 20 MG/ML IV SOLN
INTRAVENOUS | Status: DC | PRN
  Administered 2016-06-27: 40 mg via INTRAVENOUS
  Administered 2016-06-27: 300 mg via INTRAVENOUS

## 2016-06-27 MED ORDER — STERILE WATER FOR IRRIGATION IR SOLN
Status: DC | PRN
  Administered 2016-06-27: 08:00:00

## 2016-06-27 MED ORDER — PROPOFOL 10 MG/ML IV BOLUS
INTRAVENOUS | Status: DC | PRN
  Administered 2016-06-27 (×3): 50 mg via INTRAVENOUS
  Administered 2016-06-27: 100 mg via INTRAVENOUS
  Administered 2016-06-27: 50 mg via INTRAVENOUS

## 2016-06-27 MED ORDER — LACTATED RINGERS IV SOLN
INTRAVENOUS | Status: DC
  Administered 2016-06-27: 08:00:00 via INTRAVENOUS

## 2016-06-27 SURGICAL SUPPLY — 23 items
CANISTER SUCT 1200ML W/VALVE (MISCELLANEOUS) ×3 IMPLANT
CLIP HMST 235XBRD CATH ROT (MISCELLANEOUS) IMPLANT
CLIP RESOLUTION 360 11X235 (MISCELLANEOUS)
FCP ESCP3.2XJMB 240X2.8X (MISCELLANEOUS)
FORCEPS BIOP RAD 4 LRG CAP 4 (CUTTING FORCEPS) ×3 IMPLANT
FORCEPS BIOP RJ4 240 W/NDL (MISCELLANEOUS)
FORCEPS ESCP3.2XJMB 240X2.8X (MISCELLANEOUS) IMPLANT
GOWN CVR UNV OPN BCK APRN NK (MISCELLANEOUS) ×4 IMPLANT
GOWN ISOL THUMB LOOP REG UNIV (MISCELLANEOUS) ×2
INJECTOR VARIJECT VIN23 (MISCELLANEOUS) IMPLANT
KIT DEFENDO VALVE AND CONN (KITS) IMPLANT
KIT ENDO PROCEDURE OLY (KITS) ×3 IMPLANT
MARKER SPOT ENDO TATTOO 5ML (MISCELLANEOUS) IMPLANT
PAD GROUND ADULT SPLIT (MISCELLANEOUS) IMPLANT
PROBE APC STR FIRE (PROBE) IMPLANT
RETRIEVER NET ROTH 2.5X230 LF (MISCELLANEOUS) IMPLANT
SNARE SHORT THROW 13M SML OVAL (MISCELLANEOUS) ×3 IMPLANT
SNARE SHORT THROW 30M LRG OVAL (MISCELLANEOUS) IMPLANT
SNARE SNG USE RND 15MM (INSTRUMENTS) IMPLANT
SPOT EX ENDOSCOPIC TATTOO (MISCELLANEOUS)
TRAP ETRAP POLY (MISCELLANEOUS) ×3 IMPLANT
VARIJECT INJECTOR VIN23 (MISCELLANEOUS)
WATER STERILE IRR 250ML POUR (IV SOLUTION) ×3 IMPLANT

## 2016-06-27 NOTE — Anesthesia Preprocedure Evaluation (Signed)
Anesthesia Evaluation  Patient identified by MRN, date of birth, ID band Patient awake    Reviewed: Allergy & Precautions, H&P , NPO status , Patient's Chart, lab work & pertinent test results  Airway Mallampati: II  TM Distance: >3 FB Neck ROM: full    Dental no notable dental hx.    Pulmonary former smoker,    Pulmonary exam normal        Cardiovascular hypertension, Normal cardiovascular exam     Neuro/Psych    GI/Hepatic   Endo/Other  diabetes  Renal/GU      Musculoskeletal   Abdominal   Peds  Hematology   Anesthesia Other Findings   Reproductive/Obstetrics                             Anesthesia Physical Anesthesia Plan  ASA: II  Anesthesia Plan: MAC   Post-op Pain Management:    Induction:   Airway Management Planned:   Additional Equipment:   Intra-op Plan:   Post-operative Plan:   Informed Consent: I have reviewed the patients History and Physical, chart, labs and discussed the procedure including the risks, benefits and alternatives for the proposed anesthesia with the patient or authorized representative who has indicated his/her understanding and acceptance.     Plan Discussed with:   Anesthesia Plan Comments:         Anesthesia Quick Evaluation

## 2016-06-27 NOTE — Transfer of Care (Signed)
Immediate Anesthesia Transfer of Care Note  Patient: Darius Brown.  Procedure(s) Performed: Procedure(s) with comments: COLONOSCOPY WITH PROPOFOL (N/A) - Diabetic - insulin and oral meds POLYPECTOMY  Patient Location: PACU  Anesthesia Type: MAC  Level of Consciousness: awake, alert  and patient cooperative  Airway and Oxygen Therapy: Patient Spontanous Breathing and Patient connected to supplemental oxygen  Post-op Assessment: Post-op Vital signs reviewed, Patient's Cardiovascular Status Stable, Respiratory Function Stable, Patent Airway and No signs of Nausea or vomiting  Post-op Vital Signs: Reviewed and stable  Complications: No apparent anesthesia complications

## 2016-06-27 NOTE — Op Note (Signed)
[facility] Gastroenterology Patient Name: Darius Brown Procedure Date: 06/27/2016 8:14 AM MRN: [id number] Account #: [id number] Date of Birth: July 09, 1955 Admit Type: Outpatient Age: 61 Room: [facility] OR ROOM 01 Gender: Male Note Status: Finalized Procedure:            Colonoscopy Indications:          Screening for colorectal malignant neoplasm Providers:            Lucilla Lame MD, MD Referring MD:         Juline Patch, MD (Referring MD) Medicines:            Propofol per Anesthesia Complications:        No immediate complications. Procedure:            Pre-Anesthesia Assessment:                       - Prior to the procedure, a History and Physical was                        performed, and patient medications and allergies were                        reviewed. The patient's tolerance of previous                        anesthesia was also reviewed. The risks and benefits of                        the procedure and the sedation options and risks were                        discussed with the patient. All questions were                        answered, and informed consent was obtained. Prior                        Anticoagulants: The patient has taken no previous                        anticoagulant or antiplatelet agents. ASA Grade                        Assessment: II - A patient with mild systemic disease.                        After reviewing the risks and benefits, the patient was                        deemed in satisfactory condition to undergo the                        procedure.                       After obtaining informed consent, the colonoscope was                        passed under direct vision. Throughout the procedure,  the patient's blood pressure, pulse, and oxygen                        saturations were monitored continuously. The [place] (S#: [id number]) was introduced  through                        the anus and advanced to the the cecum, identified by                        appendiceal orifice and ileocecal valve. The                        colonoscopy was performed without difficulty. The                        patient tolerated the procedure well. The quality of                        the bowel preparation was excellent. Findings:      The perianal and digital rectal examinations were normal.      A 4 mm polyp was found in the descending colon. The polyp was sessile.       The polyp was removed with a cold biopsy forceps. Resection and       retrieval were complete.      A 4 mm polyp was found in the ascending colon. The polyp was sessile.       The polyp was removed with a cold biopsy forceps. Resection and       retrieval were complete.      A 7 mm polyp was found in the ascending colon. The polyp was sessile.       The polyp was removed with a cold snare. Resection and retrieval were       complete.      Multiple small-mouthed diverticula were found in the sigmoid colon.      Non-bleeding internal hemorrhoids were found during retroflexion. The       hemorrhoids were Grade II (internal hemorrhoids that prolapse but reduce       spontaneously). Impression:           - One 4 mm polyp in the descending colon, removed with                        a cold biopsy forceps. Resected and retrieved.                       - One 4 mm polyp in the ascending colon, removed with a                        cold biopsy forceps. Resected and retrieved.                       - One 7 mm polyp in the ascending colon, removed with a                        cold snare. Resected and retrieved.                       -  Diverticulosis in the sigmoid colon.                       - Non-bleeding internal hemorrhoids. Recommendation:       - Discharge patient to home.                       - Resume previous diet.                       - Continue present medications.                        - Await pathology results.                       - Repeat colonoscopy in 5 years if polyp adenoma and 10                        years if hyperplastic Procedure Code(s):    --- Professional ---                       [phone], Colonoscopy, flexible; with removal of tumor(s),                        polyp(s), or other lesion(s) by snare technique                       45380, 59, Colonoscopy, flexible; with biopsy, single                        or multiple Diagnosis Code(s):    --- Professional ---                       Z12.11, Encounter for screening for malignant neoplasm                        of colon                       D12.4, Benign neoplasm of descending colon                       D12.2, Benign neoplasm of ascending colon CPT copyright 2016 American Medical Association. All rights reserved. The codes documented in this report are preliminary and upon coder review may  be revised to meet current compliance requirements. Lucilla Lame MD, MD 06/27/2016 8:35:33 AM This report has been signed electronically. Number of Addenda: 0 Note Initiated On: 06/27/2016 8:14 AM Scope Withdrawal Time: 0 hours 6 minutes 57 seconds  Total Procedure Duration: 0 hours 10 minutes 39 seconds       [facility]

## 2016-06-27 NOTE — Anesthesia Postprocedure Evaluation (Signed)
Anesthesia Post Note  Patient: Darius Brown.  Procedure(s) Performed: Procedure(s) (LRB): COLONOSCOPY WITH PROPOFOL (N/A) POLYPECTOMY  Patient location during evaluation: PACU Anesthesia Type: MAC Level of consciousness: awake and alert and oriented Pain management: satisfactory to patient Vital Signs Assessment: post-procedure vital signs reviewed and stable Respiratory status: spontaneous breathing, nonlabored ventilation and respiratory function stable Cardiovascular status: blood pressure returned to baseline and stable Postop Assessment: Adequate PO intake and No signs of nausea or vomiting Anesthetic complications: no    Raliegh Ip

## 2016-06-27 NOTE — Anesthesia Procedure Notes (Signed)
Procedure Name: MAC Date/Time: 06/27/2016 8:17 AM Performed by: Janna Arch Pre-anesthesia Checklist: Patient identified, Emergency Drugs available, Suction available and Patient being monitored Patient Re-evaluated:Patient Re-evaluated prior to inductionOxygen Delivery Method: Nasal cannula

## 2016-06-27 NOTE — H&P (Signed)
Darius Lame, MD [address] [place], [place] [address] Phone: [phone] Fax : [phone]  Primary Care Physician:  Otilio Miu, MD Primary Gastroenterologist:  Dr. Allen Norris  Pre-Procedure History & Physical: HPI:  Darius Snee. is a 61 y.o. male is here for a screening colonoscopy.   Past Medical History:  Diagnosis Date  . Diabetes mellitus without complication ([place])   . Hyperlipidemia   . Hypertension     Past Surgical History:  Procedure Laterality Date  . APPENDECTOMY    . COLONOSCOPY  2007   repeat in 10 years  . HERNIA REPAIR     x 2  . KNEE SURGERY Right   . KNEE SURGERY Left   . TONSILLECTOMY      Prior to Admission medications   Medication Sig Start Date End Date Taking? Authorizing Provider  acetaminophen (TYLENOL) 650 MG CR tablet Take by mouth.   Yes Historical Provider, MD  aspirin EC 81 MG tablet Take 1 tablet by mouth daily. 06/15/14  Yes Historical Provider, MD  atorvastatin (LIPITOR) 20 MG tablet Take 1 tablet (20 mg total) by mouth daily. 03/27/16  Yes Juline Patch, MD  empagliflozin (JARDIANCE) 10 MG TABS tablet Take 10 mg by mouth daily. Dr Maretta Bees 03/08/16  Yes Historical Provider, MD  liraglutide (VICTOZA) 18 MG/3ML SOPN Inject 18 mg into the skin daily. Dr Maretta Bees 02/12/16 02/11/17 Yes Historical Provider, MD  lisinopril (PRINIVIL,ZESTRIL) 5 MG tablet Take 1 tablet (5 mg total) by mouth daily. 03/27/16  Yes Juline Patch, MD  metFORMIN (GLUCOPHAGE) 1000 MG tablet Take 1 tablet (1,000 mg total) by mouth 2 (two) times daily. Patient taking differently: Take 1,000 mg by mouth 2 (two) times daily. Dr Maretta Bees 03/14/15  Yes Juline Patch, MD  Omega-3 1000 MG CAPS Take 2 capsules by mouth 2 (two) times daily. 02/12/16 02/11/17 Yes Historical Provider, MD  Insulin Pen Needle (FIFTY50 PEN NEEDLES) 31G X 8 MM MISC Use once daily. 02/12/16 02/11/17  Historical Provider, MD    Allergies as of 06/12/2016  . (No Known Allergies)    History  reviewed. No pertinent family history.  Social History   Social History  . Marital status: Married    Spouse name: N/A  . Number of children: N/A  . Years of education: N/A   Occupational History  . Not on file.   Social History Main Topics  . Smoking status: Former Smoker    Quit date: 04/01/2006  . Smokeless tobacco: Never Used  . Alcohol use 0.0 oz/week     Comment: none recently  . Drug use: No  . Sexual activity: Yes   Other Topics Concern  . Not on file   Social History Narrative  . No narrative on file    Review of Systems: See HPI, otherwise negative ROS  Physical Exam: BP (!) 127/96   Pulse 78   Temp 97.9 F (36.6 C) (Temporal)   Ht 5\' 11"  (1.803 m)   Wt 241 lb (109.3 kg)   SpO2 97%   BMI 33.61 kg/m  General:   Alert,  pleasant and cooperative in NAD Head:  Normocephalic and atraumatic. Neck:  Supple; no masses or thyromegaly. Lungs:  Clear throughout to auscultation.    Heart:  Regular rate and rhythm. Abdomen:  Soft, nontender and nondistended. Normal bowel sounds, without guarding, and without rebound.   Neurologic:  Alert and  oriented x4;  grossly normal neurologically.  Impression/Plan: Darius Brown. is  now here to undergo a screening colonoscopy.  Risks, benefits, and alternatives regarding colonoscopy have been reviewed with the patient.  Questions have been answered.  All parties agreeable.

## 2016-07-02 ENCOUNTER — Encounter: Payer: Self-pay | Admitting: Gastroenterology

## 2016-07-26 DIAGNOSIS — E781 Pure hyperglyceridemia: Secondary | ICD-10-CM | POA: Diagnosis not present

## 2016-07-26 DIAGNOSIS — E119 Type 2 diabetes mellitus without complications: Secondary | ICD-10-CM | POA: Diagnosis not present

## 2016-08-02 DIAGNOSIS — E669 Obesity, unspecified: Secondary | ICD-10-CM | POA: Diagnosis not present

## 2016-08-02 DIAGNOSIS — E781 Pure hyperglyceridemia: Secondary | ICD-10-CM | POA: Diagnosis not present

## 2016-08-02 DIAGNOSIS — E119 Type 2 diabetes mellitus without complications: Secondary | ICD-10-CM | POA: Diagnosis not present

## 2016-10-12 ENCOUNTER — Other Ambulatory Visit: Payer: Self-pay | Admitting: Family Medicine

## 2016-10-12 DIAGNOSIS — E782 Mixed hyperlipidemia: Secondary | ICD-10-CM

## 2016-10-12 DIAGNOSIS — I10 Essential (primary) hypertension: Secondary | ICD-10-CM

## 2016-11-16 ENCOUNTER — Other Ambulatory Visit: Payer: Self-pay | Admitting: Family Medicine

## 2016-11-16 ENCOUNTER — Ambulatory Visit
Admission: EM | Admit: 2016-11-16 | Discharge: 2016-11-16 | Disposition: A | Discharge status: Home / Self Care | Payer: Commercial Indemnity | Attending: Family Medicine | Admitting: Family Medicine

## 2016-11-16 DIAGNOSIS — B9789 Other viral agents as the cause of diseases classified elsewhere: Principal | ICD-10-CM

## 2016-11-16 DIAGNOSIS — I10 Essential (primary) hypertension: Secondary | ICD-10-CM

## 2016-11-16 DIAGNOSIS — J069 Acute upper respiratory infection, unspecified: Secondary | ICD-10-CM

## 2016-11-16 DIAGNOSIS — E782 Mixed hyperlipidemia: Secondary | ICD-10-CM

## 2016-11-16 DIAGNOSIS — J3489 Other specified disorders of nose and nasal sinuses: Secondary | ICD-10-CM

## 2016-11-16 MED ORDER — AZITHROMYCIN 250 MG PO TABS: 250.0000 mg | ORAL_TABLET | Freq: Every day | ORAL | 0 refills | Status: DC

## 2016-11-16 MED ORDER — FLUTICASONE PROPIONATE 50 MCG/ACT NA SUSP: 1.0000 | Freq: Every day | NASAL | 2 refills | Status: DC

## 2016-11-16 MED ORDER — PSEUDOEPH-BROMPHEN-DM 30-2-10 MG/5ML PO SYRP: 5.0000 mL | ORAL_SOLUTION | Freq: Three times a day (TID) | ORAL | 0 refills | Status: DC | PRN

## 2016-11-16 MED ORDER — HYDROCOD POLST-CPM POLST ER 10-8 MG/5ML PO SUER: 5.0000 mL | Freq: Every evening | ORAL | 0 refills | Status: DC | PRN

## 2016-11-16 NOTE — ED Provider Notes (Signed)
[facility]    CSN: [id number] Arrival date & time: 11/16/16  0350     History   Chief Complaint Chief Complaint  Patient presents with  . Cough    HPI Darius Brown. is a 61 y.o. male presents for sinus drainage, sinus pressure, dry cough/mild. Sorethroat mild. No fevers, CP/SOB. Symptoms x 1 day. OTC cough medication at night with out relief.   HPI  Past Medical History:  Diagnosis Date  . Diabetes mellitus without complication ([place])   . Hyperlipidemia   . Hypertension     Patient Active Problem List   Diagnosis Date Noted  . Special screening for malignant neoplasms, colon   . Benign neoplasm of ascending colon   . Benign neoplasm of descending colon   . Familial multiple lipoprotein-type hyperlipidemia 08/23/2014  . Cerebrovascular accident, impending ([place]) 08/23/2014  . Temporomandibular joint disorders 08/23/2014  . Arthropathy of ankle and foot 08/23/2014  . Aftercare following right knee joint replacement surgery 08/23/2014  . Diabetes mellitus type 2, uncontrolled ([place]) 08/23/2014  . Essential (primary) hypertension 08/23/2014  . Adult BMI 30+ 08/23/2014  . Status post left knee replacement 07/01/2014  . Abnormal stress test 05/25/2014  . HTN (hypertension) 05/25/2014  . Hyperlipemia 05/25/2014  . Obesity 05/25/2014  . Type 2 diabetes mellitus without complication ([place]) 09/38/1829  . Primary osteoarthritis of left knee 05/19/2014  . Primary localized osteoarthrosis, lower leg 06/30/2013    Past Surgical History:  Procedure Laterality Date  . APPENDECTOMY    . COLONOSCOPY  2007   repeat in 10 years  . COLONOSCOPY WITH PROPOFOL N/A 06/27/2016   Procedure: COLONOSCOPY WITH PROPOFOL;  Surgeon: Lucilla Lame, MD;  Location: [place];  Service: Endoscopy;  Laterality: N/A;  Diabetic - insulin and oral meds  . HERNIA REPAIR     x 2  . KNEE SURGERY Right   . KNEE SURGERY Left   . POLYPECTOMY  06/27/2016   Procedure: POLYPECTOMY;   Surgeon: Lucilla Lame, MD;  Location: [place];  Service: Endoscopy;;  . TONSILLECTOMY         Home Medications    Prior to Admission medications   Medication Sig Start Date End Date Taking? Authorizing Provider  acetaminophen (TYLENOL) 650 MG CR tablet Take by mouth.    [provider]  aspirin EC 81 MG tablet Take 1 tablet by mouth daily. 06/15/14   [provider]  atorvastatin (LIPITOR) 20 MG tablet TAKE 1 TABLET BY MOUTH EVERY DAY (NEED APPT) 11/16/16   Juline Patch, MD  azithromycin (ZITHROMAX) 250 MG tablet Take 1 tablet (250 mg total) by mouth daily. Take first 2 tablets together, then 1 every day until finished. 11/16/16   Duanne Guess, PA-C  brompheniramine-pseudoephedrine-DM 30-2-10 MG/5ML syrup Take 5 mLs by mouth 3 (three) times daily as needed. 11/16/16   Duanne Guess, PA-C  chlorpheniramine-HYDROcodone (TUSSIONEX PENNKINETIC ER) 10-8 MG/5ML SUER Take 5 mLs by mouth at bedtime as needed for cough. 11/16/16   Duanne Guess, PA-C  empagliflozin (JARDIANCE) 10 MG TABS tablet Take 10 mg by mouth daily. Dr Maretta Bees 03/08/16   [provider]  fluticasone (FLONASE) 50 MCG/ACT nasal spray Place 1 spray into both nostrils daily. 11/16/16   Duanne Guess, PA-C  Insulin Pen Needle (FIFTY50 PEN NEEDLES) 31G X 8 MM MISC Use once daily. 02/12/16 02/11/17  [provider]  liraglutide (VICTOZA) 18 MG/3ML SOPN Inject 18 mg into the skin daily. Dr Maretta Bees  02/12/16 02/11/17  [provider]  lisinopril (PRINIVIL,ZESTRIL) 5 MG tablet TAKE 1 TABLET BY MOUTH EVERY DAY (NEED APPT) 11/16/16   Juline Patch, MD  metFORMIN (GLUCOPHAGE) 1000 MG tablet Take 1 tablet (1,000 mg total) by mouth 2 (two) times daily. Patient taking differently: Take 1,000 mg by mouth 2 (two) times daily. Dr Maretta Bees 03/14/15   Juline Patch, MD  Omega-3 1000 MG CAPS Take 2 capsules by mouth 2 (two) times daily. 02/12/16 02/11/17  [provider]    Family  History History reviewed. No pertinent family history.  Social History Social History  Substance Use Topics  . Smoking status: Former Smoker    Quit date: 04/01/2006  . Smokeless tobacco: Never Used  . Alcohol use 0.0 oz/week     Comment: none recently     Allergies   Patient has no known allergies.   Review of Systems Review of Systems  Constitutional: Negative.  Negative for activity change, appetite change, chills and fever.  HENT: Positive for congestion, ear pain, rhinorrhea, sinus pressure and sore throat. Negative for mouth sores and trouble swallowing.   Eyes: Negative for photophobia, pain and discharge.  Respiratory: Positive for cough (dry). Negative for chest tightness and shortness of breath.   Cardiovascular: Negative for chest pain and leg swelling.  Gastrointestinal: Negative for abdominal distention, abdominal pain, diarrhea, nausea and vomiting.  Genitourinary: Negative for difficulty urinating and dysuria.  Musculoskeletal: Negative for arthralgias, back pain and gait problem.  Skin: Negative for color change and rash.  Neurological: Negative for dizziness and headaches.  Hematological: Negative for adenopathy.  Psychiatric/Behavioral: Negative for agitation and behavioral problems.     Physical Exam Triage Vital Signs ED Triage Vitals  Enc Vitals Group     BP 11/16/16 0942 (!) 131/91     Pulse Rate 11/16/16 0942 86     Resp 11/16/16 0942 16     Temp 11/16/16 0942 98.2 F (36.8 C)     Temp Source 11/16/16 0942 Oral     SpO2 11/16/16 0942 96 %     Weight 11/16/16 0941 235 lb (106.6 kg)     Height 11/16/16 0941 5' 10.5" (1.791 m)     Head Circumference --      Peak Flow --      Pain Score --      Pain Loc --      Pain Edu? --      Excl. in [place]? --    No data found.   Updated Vital Signs BP (!) 131/91 (BP Location: Left Arm)   Pulse 86   Temp 98.2 F (36.8 C) (Oral)   Resp 16   Ht 5' 10.5" (1.791 m)   Wt 235 lb (106.6 kg)   SpO2 96%    BMI 33.24 kg/m   Visual Acuity Right Eye Distance:   Left Eye Distance:   Bilateral Distance:    Right Eye Near:   Left Eye Near:    Bilateral Near:     Physical Exam  Constitutional: He is oriented to person, place, and time. He appears well-developed and well-nourished.  HENT:  Head: Normocephalic and atraumatic.  Right Ear: External ear normal.  Left Ear: External ear normal.  Mouth/Throat: Oropharynx is clear and moist.  No sinus tenderness to palpation  Eyes: Pupils are equal, round, and reactive to light. Conjunctivae and EOM are normal. Right eye exhibits no discharge. Left eye exhibits no discharge.  Neck: Normal range of motion. Neck supple.  Cardiovascular: Normal rate, regular rhythm, normal heart sounds and intact distal pulses.   Pulmonary/Chest: Effort normal and breath sounds normal. No respiratory distress. He has no wheezes. He has no rales. He exhibits no tenderness.  Abdominal: Soft. There is no tenderness.  Musculoskeletal: Normal range of motion. He exhibits no edema or tenderness.  Lymphadenopathy:    He has cervical adenopathy (posterior).  Neurological: He is alert and oriented to person, place, and time.  Skin: Skin is warm and dry. No rash noted.  Psychiatric: He has a normal mood and affect. His behavior is normal. Judgment and thought content normal.     UC Treatments / Results  Labs (all labs ordered are listed, but only abnormal results are displayed) Labs Reviewed - No data to display  EKG  EKG Interpretation None       Radiology No results found.  Procedures Procedures (including critical care time)  Medications Ordered in UC Medications - No data to display   Initial Impression / Assessment and Plan / UC Course  I have reviewed the triage vital signs and the nursing notes.  Pertinent labs & imaging results that were available during my care of the patient were reviewed by me and considered in my medical decision making (see  chart for details).     Will treat patients symptoms for viral illness. Discussed viral vrs bacterial disease and current lack of need for antibiotics. Patient agreed to post dated antibiotic. Will treat symptoms for next 6 days and if not improving in 6 days will start antibiotic.  Final Clinical Impressions(s) / UC Diagnoses   Final diagnoses:  Viral URI with cough  Sinus drainage    New Prescriptions Discharge Medication List as of 11/16/2016 10:19 AM    START taking these medications   Details  atorvastatin (LIPITOR) 20 MG tablet TAKE 1 TABLET BY MOUTH EVERY DAY (NEED APPT), Normal    azithromycin (ZITHROMAX) 250 MG tablet Take 1 tablet (250 mg total) by mouth daily. Take first 2 tablets together, then 1 every day until finished., Starting Sat 11/16/2016, Print    brompheniramine-pseudoephedrine-DM 30-2-10 MG/5ML syrup Take 5 mLs by mouth 3 (three) times daily as needed., Starting Sat 11/16/2016, Print    chlorpheniramine-HYDROcodone (TUSSIONEX PENNKINETIC ER) 10-8 MG/5ML SUER Take 5 mLs by mouth at bedtime as needed for cough., Starting Sat 11/16/2016, Print    fluticasone (FLONASE) 50 MCG/ACT nasal spray Place 1 spray into both nostrils daily., Starting Sat 11/16/2016, Print    lisinopril (PRINIVIL,ZESTRIL) 5 MG tablet TAKE 1 TABLET BY MOUTH EVERY DAY (NEED APPT), Normal         Controlled Substance Prescriptions [place] Controlled Substance Registry consulted? Yes, I have consulted the [place] Controlled Substances Registry for this patient, and feel the risk/benefit ratio today is favorable for proceeding with this prescription for a controlled substance.   Duanne Guess, PA-C 11/16/16 1038

## 2016-11-16 NOTE — ED Triage Notes (Signed)
Pt states "sinus drainage" and hoarse. Sx started yesterday. Has had cough which started yesterday afternoon. Denies pain or fever. States it is consistent with his previous sinus infections.

## 2016-11-16 NOTE — Discharge Instructions (Signed)
Please take medications as prescribed to help treat your cold symptoms. Please drink lots of fluids. Start z-pack if no improvement in 6 days. Return to the clinic for any fevers, shortness of breath, worsening symptoms or for any urgent changes in your health.

## 2016-12-12 ENCOUNTER — Other Ambulatory Visit: Payer: Self-pay | Admitting: Family Medicine

## 2016-12-12 DIAGNOSIS — I10 Essential (primary) hypertension: Secondary | ICD-10-CM

## 2016-12-12 DIAGNOSIS — E782 Mixed hyperlipidemia: Secondary | ICD-10-CM

## 2017-01-03 DIAGNOSIS — E785 Hyperlipidemia, unspecified: Secondary | ICD-10-CM | POA: Diagnosis not present

## 2017-01-03 DIAGNOSIS — E119 Type 2 diabetes mellitus without complications: Secondary | ICD-10-CM | POA: Diagnosis not present

## 2017-01-03 DIAGNOSIS — E1169 Type 2 diabetes mellitus with other specified complication: Secondary | ICD-10-CM | POA: Diagnosis not present

## 2017-01-03 DIAGNOSIS — E1159 Type 2 diabetes mellitus with other circulatory complications: Secondary | ICD-10-CM | POA: Diagnosis not present

## 2017-01-06 ENCOUNTER — Other Ambulatory Visit: Payer: Self-pay

## 2017-01-08 ENCOUNTER — Ambulatory Visit (INDEPENDENT_AMBULATORY_CARE_PROVIDER_SITE_OTHER): Payer: Commercial Indemnity | Admitting: Family Medicine

## 2017-01-08 ENCOUNTER — Encounter: Payer: Self-pay | Admitting: Family Medicine

## 2017-01-08 VITALS — BP 120/80 | HR 68 | Ht 70.5 in | Wt 247.0 lb

## 2017-01-08 DIAGNOSIS — R69 Illness, unspecified: Secondary | ICD-10-CM | POA: Diagnosis not present

## 2017-01-08 DIAGNOSIS — Z23 Encounter for immunization: Secondary | ICD-10-CM | POA: Diagnosis not present

## 2017-01-08 DIAGNOSIS — E782 Mixed hyperlipidemia: Secondary | ICD-10-CM | POA: Diagnosis not present

## 2017-01-08 MED ORDER — ATORVASTATIN CALCIUM 20 MG PO TABS: ORAL_TABLET | ORAL | 1 refills | Status: DC

## 2017-01-08 NOTE — Progress Notes (Signed)
Name: Darius Brown.   MRN: [id number]    DOB: Oct 12, 1955   Date:01/08/2017       Progress Note  Subjective  Chief Complaint  Chief Complaint  Patient presents with  . Hyperlipidemia    Hyperlipidemia  This is a chronic problem. The current episode started more than 1 month ago. The problem is controlled. Recent lipid tests were reviewed and are normal. He has no history of chronic renal disease, diabetes, hypothyroidism, liver disease, obesity or nephrotic syndrome. There are no known factors aggravating his hyperlipidemia. Pertinent negatives include no chest pain, focal sensory loss, focal weakness, leg pain, myalgias or shortness of breath. Current antihyperlipidemic treatment includes statins. The current treatment provides mild improvement of lipids. There are no compliance problems.  Risk factors for coronary artery disease include obesity, dyslipidemia, hypertension and diabetes mellitus.    No problem-specific Assessment & Plan notes found for this encounter.   Past Medical History:  Diagnosis Date  . Diabetes mellitus without complication ([place])   . Hyperlipidemia   . Hypertension     Past Surgical History:  Procedure Laterality Date  . APPENDECTOMY    . COLONOSCOPY  2007   repeat in 10 years  . COLONOSCOPY WITH PROPOFOL N/A 06/27/2016   Procedure: COLONOSCOPY WITH PROPOFOL;  Surgeon: Lucilla Lame, MD;  Location: [place];  Service: Endoscopy;  Laterality: N/A;  Diabetic - insulin and oral meds  . HERNIA REPAIR     x 2  . KNEE SURGERY Right   . KNEE SURGERY Left   . POLYPECTOMY  06/27/2016   Procedure: POLYPECTOMY;  Surgeon: Lucilla Lame, MD;  Location: [place];  Service: Endoscopy;;  . TONSILLECTOMY      No family history on file.  Social History   Social History  . Marital status: Married    Spouse name: N/A  . Number of children: N/A  . Years of education: N/A   Occupational History  . Not on file.   Social History Main Topics   . Smoking status: Former Smoker    Quit date: 04/01/2006  . Smokeless tobacco: Never Used  . Alcohol use 0.0 oz/week     Comment: none recently  . Drug use: No  . Sexual activity: Yes   Other Topics Concern  . Not on file   Social History Narrative  . No narrative on file    No Known Allergies  Outpatient Medications Prior to Visit  Medication Sig Dispense Refill  . aspirin EC 81 MG tablet Take 1 tablet by mouth daily.    . empagliflozin (JARDIANCE) 25 MG TABS tablet Take 25 mg by mouth daily. Dr Maretta Bees    . Insulin Pen Needle (FIFTY50 PEN NEEDLES) 31G X 8 MM MISC Use once daily.    Marland Kitchen liraglutide (VICTOZA) 18 MG/3ML SOPN Inject 1.8 mg into the skin daily. Dr Maretta Bees    . metFORMIN (GLUCOPHAGE) 1000 MG tablet Take 1 tablet (1,000 mg total) by mouth 2 (two) times daily. (Patient taking differently: Take 1,000 mg by mouth 2 (two) times daily. Dr Maretta Bees) 180 tablet 1  . Omega-3 1000 MG CAPS Take 2 capsules by mouth 2 (two) times daily.    Marland Kitchen atorvastatin (LIPITOR) 20 MG tablet TAKE 1 TABLET BY MOUTH EVERY DAY (NEED APPT) 15 tablet 0  . acetaminophen (TYLENOL) 650 MG CR tablet Take by mouth.    Marland Kitchen azithromycin (ZITHROMAX) 250 MG tablet Take 1 tablet (250 mg total) by mouth daily. Take first 2 tablets  together, then 1 every day until finished. 6 tablet 0  . brompheniramine-pseudoephedrine-DM 30-2-10 MG/5ML syrup Take 5 mLs by mouth 3 (three) times daily as needed. 60 mL 0  . chlorpheniramine-HYDROcodone (TUSSIONEX PENNKINETIC ER) 10-8 MG/5ML SUER Take 5 mLs by mouth at bedtime as needed for cough. 60 mL 0  . fluticasone (FLONASE) 50 MCG/ACT nasal spray Place 1 spray into both nostrils daily. 16 g 2  . lisinopril (PRINIVIL,ZESTRIL) 5 MG tablet TAKE 1 TABLET BY MOUTH EVERY DAY (NEED APPT) 15 tablet 0   No facility-administered medications prior to visit.     Review of Systems  Constitutional: Negative for chills, fever, malaise/fatigue and weight loss.  HENT: Negative for ear discharge, ear pain  and sore throat.   Eyes: Negative for blurred vision.  Respiratory: Negative for cough, sputum production, shortness of breath and wheezing.   Cardiovascular: Negative for chest pain, palpitations and leg swelling.  Gastrointestinal: Negative for abdominal pain, blood in stool, constipation, diarrhea, heartburn, melena and nausea.  Genitourinary: Negative for dysuria, frequency, hematuria and urgency.  Musculoskeletal: Negative for back pain, joint pain, myalgias and neck pain.  Skin: Negative for rash.  Neurological: Negative for dizziness, tingling, sensory change, focal weakness and headaches.  Endo/Heme/Allergies: Negative for environmental allergies and polydipsia. Does not bruise/bleed easily.  Psychiatric/Behavioral: Negative for depression and suicidal ideas. The patient is not nervous/anxious and does not have insomnia.      Objective  Vitals:   01/08/17 0814  BP: 120/80  Pulse: 68  Weight: 247 lb (112 kg)  Height: 5' 10.5" (1.791 m)    Physical Exam  Constitutional: He is oriented to person, place, and time and well-developed, well-nourished, and in no distress.  HENT:  Head: Normocephalic.  Right Ear: External ear normal.  Left Ear: External ear normal.  Nose: Nose normal.  Mouth/Throat: Oropharynx is clear and moist.  Eyes: Pupils are equal, round, and reactive to light. Conjunctivae and EOM are normal. Right eye exhibits no discharge. Left eye exhibits no discharge. No scleral icterus.  Neck: Normal range of motion. Neck supple. No JVD present. No tracheal deviation present. No thyromegaly present.  Cardiovascular: Normal rate, regular rhythm, normal heart sounds and intact distal pulses.  Exam reveals no gallop and no friction rub.   No murmur heard. Pulmonary/Chest: Breath sounds normal. No respiratory distress. He has no wheezes. He has no rales.  Abdominal: Soft. Bowel sounds are normal. He exhibits no mass. There is no hepatosplenomegaly. There is no tenderness.  There is no rebound, no guarding and no CVA tenderness.  Musculoskeletal: Normal range of motion. He exhibits no edema or tenderness.  Lymphadenopathy:    He has no cervical adenopathy.  Neurological: He is alert and oriented to person, place, and time. He has normal sensation, normal strength, normal reflexes and intact cranial nerves. No cranial nerve deficit.  Skin: Skin is warm. No rash noted.  Psychiatric: Mood and affect normal.  Nursing note and vitals reviewed.     Assessment & Plan  Problem List Items Addressed This Visit      Other   Hyperlipemia - Primary   Relevant Medications   lisinopril (PRINIVIL,ZESTRIL) 10 MG tablet   atorvastatin (LIPITOR) 20 MG tablet   Other Relevant Orders   Lipid Profile   Taking medication for chronic disease   Relevant Orders   Hepatic function panel    Other Visit Diagnoses    Influenza vaccine needed       Relevant Orders   Flu Vaccine  QUAD 36+ mos IM (Completed)      Meds ordered this encounter  Medications  . atorvastatin (LIPITOR) 20 MG tablet    Sig: TAKE 1 TABLET BY MOUTH EVERY DAY    Dispense:  90 tablet    Refill:  1      Dr. Deanna Jones [facility]  01/08/17

## 2017-01-08 NOTE — Patient Instructions (Signed)

## 2017-02-02 ENCOUNTER — Other Ambulatory Visit: Payer: Self-pay | Admitting: Family Medicine

## 2017-02-02 DIAGNOSIS — I10 Essential (primary) hypertension: Secondary | ICD-10-CM

## 2017-03-29 ENCOUNTER — Ambulatory Visit
Admission: EM | Admit: 2017-03-29 | Discharge: 2017-03-29 | Disposition: A | Discharge status: Home / Self Care | Payer: Federal, State, Local not specified - PPO | Attending: Family Medicine | Admitting: Family Medicine

## 2017-03-29 ENCOUNTER — Other Ambulatory Visit: Payer: Self-pay

## 2017-03-29 ENCOUNTER — Telehealth: Payer: Self-pay | Admitting: Family Medicine

## 2017-03-29 ENCOUNTER — Telehealth: Payer: Self-pay | Admitting: *Deleted

## 2017-03-29 ENCOUNTER — Encounter: Payer: Self-pay | Admitting: *Deleted

## 2017-03-29 DIAGNOSIS — B9789 Other viral agents as the cause of diseases classified elsewhere: Secondary | ICD-10-CM

## 2017-03-29 DIAGNOSIS — J069 Acute upper respiratory infection, unspecified: Secondary | ICD-10-CM

## 2017-03-29 DIAGNOSIS — R0981 Nasal congestion: Secondary | ICD-10-CM

## 2017-03-29 MED ORDER — PREDNISONE 20 MG PO TABS: 20.0000 mg | ORAL_TABLET | Freq: Every day | ORAL | 0 refills | Status: DC

## 2017-03-29 MED ORDER — HYDROCOD POLST-CPM POLST ER 10-8 MG/5ML PO SUER: 5.0000 mL | Freq: Two times a day (BID) | ORAL | 0 refills | Status: DC | PRN

## 2017-03-29 NOTE — ED Provider Notes (Signed)
[facility]    CSN: [id number] Arrival date & time: 03/29/17  5009     History   Chief Complaint Chief Complaint  Patient presents with  . Nasal Congestion    HPI Darius Brown. is a 61 y.o. male.   The history is provided by the patient.  URI  Presenting symptoms: congestion, facial pain and rhinorrhea   Severity:  Moderate Onset quality:  Sudden Duration:  2 days Timing:  Constant Progression:  Worsening Chronicity:  New Relieved by:  Nothing Ineffective treatments:  OTC medications Associated symptoms: sinus pain   Associated symptoms: no wheezing   Risk factors: not elderly     Past Medical History:  Diagnosis Date  . Diabetes mellitus without complication ([place])   . Hyperlipidemia   . Hypertension     Patient Active Problem List   Diagnosis Date Noted  . Taking medication for chronic disease 01/08/2017  . Special screening for malignant neoplasms, colon   . Benign neoplasm of ascending colon   . Benign neoplasm of descending colon   . Familial multiple lipoprotein-type hyperlipidemia 08/23/2014  . Cerebrovascular accident, impending ([place]) 08/23/2014  . Temporomandibular joint disorders 08/23/2014  . Arthropathy of ankle and foot 08/23/2014  . Aftercare following right knee joint replacement surgery 08/23/2014  . Diabetes mellitus type 2, uncontrolled ([place]) 08/23/2014  . Essential (primary) hypertension 08/23/2014  . Adult BMI 30+ 08/23/2014  . Status post left knee replacement 07/01/2014  . Abnormal stress test 05/25/2014  . HTN (hypertension) 05/25/2014  . Hyperlipemia 05/25/2014  . Obesity 05/25/2014  . Type 2 diabetes mellitus without complication ([place]) 38/18/2993  . Primary osteoarthritis of left knee 05/19/2014  . Primary localized osteoarthrosis, lower leg 06/30/2013    Past Surgical History:  Procedure Laterality Date  . APPENDECTOMY    . COLONOSCOPY  2007   repeat in 10 years  . COLONOSCOPY WITH PROPOFOL N/A  06/27/2016   Procedure: COLONOSCOPY WITH PROPOFOL;  Surgeon: Lucilla Lame, MD;  Location: [place];  Service: Endoscopy;  Laterality: N/A;  Diabetic - insulin and oral meds  . HERNIA REPAIR     x 2  . KNEE SURGERY Right   . KNEE SURGERY Left   . POLYPECTOMY  06/27/2016   Procedure: POLYPECTOMY;  Surgeon: Lucilla Lame, MD;  Location: [place];  Service: Endoscopy;;  . TONSILLECTOMY         Home Medications    Prior to Admission medications   Medication Sig Start Date End Date Taking? Authorizing Provider  aspirin EC 81 MG tablet Take 1 tablet by mouth daily. 06/15/14  Yes [provider]  atorvastatin (LIPITOR) 20 MG tablet TAKE 1 TABLET BY MOUTH EVERY DAY 01/08/17  Yes Juline Patch, MD  empagliflozin (JARDIANCE) 25 MG TABS tablet Take 25 mg by mouth daily. Dr Maretta Bees 03/08/16  Yes [provider]  liraglutide (VICTOZA) 18 MG/3ML SOPN Inject 1.8 mg into the skin daily. Dr Maretta Bees 02/12/16 03/29/17 Yes [provider]  lisinopril (PRINIVIL,ZESTRIL) 10 MG tablet Take 1 tablet by mouth daily. Dr Honor Junes 01/03/17 01/03/18 Yes [provider]  metFORMIN (GLUCOPHAGE) 1000 MG tablet Take 1 tablet (1,000 mg total) by mouth 2 (two) times daily. Patient taking differently: Take 1,000 mg by mouth 2 (two) times daily. Dr Maretta Bees 03/14/15  Yes Juline Patch, MD  pioglitazone (ACTOS) 15 MG tablet Take 1 tablet by mouth daily. Dr Honor Junes 01/03/17 01/03/18 Yes [provider]  predniSONE (DELTASONE) 20 MG tablet Take  1 tablet (20 mg total) by mouth daily. 03/29/17   Norval Gable, MD    Family History Family History  Problem Relation Age of Onset  . COPD Father     Social History Social History   Tobacco Use  . Smoking status: Former Smoker    Last attempt to quit: 04/01/2006    Years since quitting: 11.0  . Smokeless tobacco: Never Used  Substance Use Topics  . Alcohol use: Yes    Alcohol/week: 0.0 oz    Comment: none recently  .  Drug use: No     Allergies   Patient has no known allergies.   Review of Systems Review of Systems  HENT: Positive for congestion, rhinorrhea and sinus pain.   Respiratory: Negative for wheezing.      Physical Exam Triage Vital Signs ED Triage Vitals  Enc Vitals Group     BP 03/29/17 1003 (!) 144/85     Pulse Rate 03/29/17 1003 81     Resp 03/29/17 1003 16     Temp 03/29/17 1003 97.7 F (36.5 C)     Temp Source 03/29/17 1003 Oral     SpO2 03/29/17 1003 97 %     Weight 03/29/17 1004 240 lb (108.9 kg)     Height 03/29/17 1004 5\' 9"  (1.753 m)     Head Circumference --      Peak Flow --      Pain Score 03/29/17 1005 0     Pain Loc --      Pain Edu? --      Excl. in [place]? --    No data found.  Updated Vital Signs BP (!) 144/85 (BP Location: Left Arm)   Pulse 81   Temp 97.7 F (36.5 C) (Oral)   Resp 16   Ht 5\' 9"  (1.753 m)   Wt 240 lb (108.9 kg)   SpO2 97%   BMI 35.44 kg/m   Visual Acuity Right Eye Distance:   Left Eye Distance:   Bilateral Distance:    Right Eye Near:   Left Eye Near:    Bilateral Near:     Physical Exam  Constitutional: He appears well-developed and well-nourished. No distress.  HENT:  Head: Normocephalic and atraumatic.  Right Ear: Tympanic membrane, external ear and ear canal normal.  Left Ear: Tympanic membrane, external ear and ear canal normal.  Nose: Mucosal edema and rhinorrhea present.  Mouth/Throat: Uvula is midline, oropharynx is clear and moist and mucous membranes are normal. No oropharyngeal exudate or tonsillar abscesses. No tonsillar exudate.  Eyes: Conjunctivae and EOM are normal. Pupils are equal, round, and reactive to light. Right eye exhibits no discharge. Left eye exhibits no discharge. No scleral icterus.  Neck: Normal range of motion. Neck supple. No tracheal deviation present. No thyromegaly present.  Cardiovascular: Normal rate, regular rhythm and normal heart sounds.  Pulmonary/Chest: Effort normal and breath  sounds normal. No stridor. No respiratory distress. He has no wheezes. He has no rales. He exhibits no tenderness.  Lymphadenopathy:    He has no cervical adenopathy.  Neurological: He is alert.  Skin: Skin is warm and dry. No rash noted. He is not diaphoretic.  Nursing note and vitals reviewed.    UC Treatments / Results  Labs (all labs ordered are listed, but only abnormal results are displayed) Labs Reviewed - No data to display  EKG  EKG Interpretation None       Radiology No results found.  Procedures Procedures (including critical  care time)  Medications Ordered in UC Medications - No data to display   Initial Impression / Assessment and Plan / UC Course  I have reviewed the triage vital signs and the nursing notes.  Pertinent labs & imaging results that were available during my care of the patient were reviewed by me and considered in my medical decision making (see chart for details).        Final Clinical Impressions(s) / UC Diagnoses   Final diagnoses:  Nasal congestion  Viral URI    ED Discharge Orders        Ordered    predniSONE (DELTASONE) 20 MG tablet  Daily     03/29/17 1119     1. diagnosis reviewed with patient 2. rx as per orders above; reviewed possible side effects, interactions, risks and benefits  3. Recommend supportive treatment with rest, fluids, otc analgesics prn 4. Follow-up prn if symptoms worsen or don't improve   Controlled Substance Prescriptions [place] Controlled Substance Registry consulted? Not Applicable   Norval Gable, MD 03/29/17 1124

## 2017-03-29 NOTE — Telephone Encounter (Signed)
Patient called requesting cough syrup in addition to the prednisone prescribed during his visit. Tussionex escribed by Dr Zenda Alpers. Patient informed of availability of cough syrup.

## 2017-03-29 NOTE — ED Triage Notes (Addendum)
Patient started having nasal congestion / drainage, cough, and scratchy throat 2 days ago. Patient has a history of sinus infection.

## 2017-03-29 NOTE — Telephone Encounter (Signed)
rx sent for cough syrup

## 2017-05-16 DIAGNOSIS — I10 Essential (primary) hypertension: Secondary | ICD-10-CM | POA: Diagnosis not present

## 2017-05-16 DIAGNOSIS — E1159 Type 2 diabetes mellitus with other circulatory complications: Secondary | ICD-10-CM | POA: Diagnosis not present

## 2017-05-16 DIAGNOSIS — E1169 Type 2 diabetes mellitus with other specified complication: Secondary | ICD-10-CM | POA: Diagnosis not present

## 2017-05-16 DIAGNOSIS — E119 Type 2 diabetes mellitus without complications: Secondary | ICD-10-CM | POA: Diagnosis not present

## 2017-06-10 ENCOUNTER — Other Ambulatory Visit: Payer: Self-pay

## 2017-07-13 ENCOUNTER — Other Ambulatory Visit: Payer: Self-pay | Admitting: Family Medicine

## 2017-07-13 DIAGNOSIS — E782 Mixed hyperlipidemia: Secondary | ICD-10-CM

## 2017-08-01 DIAGNOSIS — K080 Exfoliation of teeth due to systemic causes: Secondary | ICD-10-CM | POA: Diagnosis not present

## 2017-08-29 DIAGNOSIS — E119 Type 2 diabetes mellitus without complications: Secondary | ICD-10-CM | POA: Diagnosis not present

## 2017-08-29 DIAGNOSIS — E1169 Type 2 diabetes mellitus with other specified complication: Secondary | ICD-10-CM | POA: Diagnosis not present

## 2017-08-29 DIAGNOSIS — I10 Essential (primary) hypertension: Secondary | ICD-10-CM | POA: Diagnosis not present

## 2017-08-29 DIAGNOSIS — E1159 Type 2 diabetes mellitus with other circulatory complications: Secondary | ICD-10-CM | POA: Diagnosis not present

## 2017-09-16 DIAGNOSIS — K080 Exfoliation of teeth due to systemic causes: Secondary | ICD-10-CM | POA: Diagnosis not present

## 2017-10-14 DIAGNOSIS — K080 Exfoliation of teeth due to systemic causes: Secondary | ICD-10-CM | POA: Diagnosis not present

## 2017-11-01 ENCOUNTER — Other Ambulatory Visit: Payer: Self-pay | Admitting: Family Medicine

## 2017-11-01 DIAGNOSIS — E782 Mixed hyperlipidemia: Secondary | ICD-10-CM

## 2017-11-19 DIAGNOSIS — K080 Exfoliation of teeth due to systemic causes: Secondary | ICD-10-CM | POA: Diagnosis not present

## 2017-12-31 ENCOUNTER — Ambulatory Visit (INDEPENDENT_AMBULATORY_CARE_PROVIDER_SITE_OTHER): Payer: Commercial Indemnity | Admitting: Family Medicine

## 2017-12-31 ENCOUNTER — Encounter: Payer: Self-pay | Admitting: Family Medicine

## 2017-12-31 VITALS — BP 110/80 | HR 64 | Ht 69.0 in | Wt 251.0 lb

## 2017-12-31 DIAGNOSIS — M5136 Other intervertebral disc degeneration, lumbar region: Secondary | ICD-10-CM | POA: Diagnosis not present

## 2017-12-31 DIAGNOSIS — Z23 Encounter for immunization: Secondary | ICD-10-CM

## 2017-12-31 DIAGNOSIS — M51369 Other intervertebral disc degeneration, lumbar region without mention of lumbar back pain or lower extremity pain: Secondary | ICD-10-CM

## 2017-12-31 MED ORDER — CYCLOBENZAPRINE HCL 10 MG PO TABS: 10.0000 mg | ORAL_TABLET | Freq: Every day | ORAL | 0 refills | Status: DC

## 2017-12-31 MED ORDER — PREDNISONE 10 MG PO TABS: 10.0000 mg | ORAL_TABLET | Freq: Every day | ORAL | 0 refills | Status: DC

## 2017-12-31 MED ORDER — MELOXICAM 15 MG PO TABS: 15.0000 mg | ORAL_TABLET | Freq: Every day | ORAL | 0 refills | Status: DC

## 2017-12-31 NOTE — Addendum Note (Signed)
Addended by: Otilio Miu C on: 12/31/2017 12:00 PM   Modules accepted: Orders

## 2017-12-31 NOTE — Progress Notes (Signed)
Date:  12/31/2017   Name:  Darius Brown.   DOB:  December 11, 1955   MRN:  [id number]   Chief Complaint: Back Pain (L) side radiating down L) leg- while sitting it feels like it is pulling through the thigh) Back Pain  This is a new problem. The current episode started more than 1 month ago (4-6 weeks ). The problem occurs constantly. The problem has been waxing and waning since onset. The pain is present in the sacro-iliac and lumbar spine. The quality of the pain is described as aching. The pain radiates to the left knee, left thigh and left foot. The pain is moderate. The symptoms are aggravated by bending (sitting). Associated symptoms include leg pain. Pertinent negatives include no bladder incontinence, bowel incontinence, chest pain, dysuria, fever, numbness, paresis, paresthesias, tingling or weakness. He has tried nothing for the symptoms.    Review of Systems  Constitutional: Negative for fever.  Cardiovascular: Negative for chest pain.  Gastrointestinal: Negative for bowel incontinence.  Genitourinary: Negative for bladder incontinence and dysuria.  Musculoskeletal: Positive for back pain.  Neurological: Negative for tingling, weakness, numbness and paresthesias.    Patient Active Problem List   Diagnosis Date Noted  . Taking medication for chronic disease 01/08/2017  . Special screening for malignant neoplasms, colon   . Benign neoplasm of ascending colon   . Benign neoplasm of descending colon   . Familial multiple lipoprotein-type hyperlipidemia 08/23/2014  . Cerebrovascular accident, impending ([place]) 08/23/2014  . Temporomandibular joint disorders 08/23/2014  . Arthropathy of ankle and foot 08/23/2014  . Aftercare following right knee joint replacement surgery 08/23/2014  . Diabetes mellitus type 2, uncontrolled ([place]) 08/23/2014  . Essential (primary) hypertension 08/23/2014  . Adult BMI 30+ 08/23/2014  . Status post left knee replacement 07/01/2014  . Abnormal  stress test 05/25/2014  . HTN (hypertension) 05/25/2014  . Hyperlipemia 05/25/2014  . Obesity 05/25/2014  . Type 2 diabetes mellitus without complication ([place]) 56/81/2751  . Primary osteoarthritis of left knee 05/19/2014  . Primary localized osteoarthrosis, lower leg 06/30/2013    No Known Allergies  Past Surgical History:  Procedure Laterality Date  . APPENDECTOMY    . COLONOSCOPY  2007   repeat in 10 years  . COLONOSCOPY WITH PROPOFOL N/A 06/27/2016   Procedure: COLONOSCOPY WITH PROPOFOL;  Surgeon: Lucilla Lame, MD;  Location: [place];  Service: Endoscopy;  Laterality: N/A;  Diabetic - insulin and oral meds  . HERNIA REPAIR     x 2  . KNEE SURGERY Right   . KNEE SURGERY Left   . POLYPECTOMY  06/27/2016   Procedure: POLYPECTOMY;  Surgeon: Lucilla Lame, MD;  Location: [place];  Service: Endoscopy;;  . TONSILLECTOMY      Social History   Tobacco Use  . Smoking status: Former Smoker    Last attempt to quit: 04/01/2006    Years since quitting: 11.7  . Smokeless tobacco: Never Used  Substance Use Topics  . Alcohol use: Yes    Alcohol/week: 0.0 standard drinks    Comment: none recently  . Drug use: No     Medication list has been reviewed and updated.  Current Meds  Medication Sig  . aspirin EC 81 MG tablet Take 1 tablet by mouth daily.  Marland Kitchen atorvastatin (LIPITOR) 20 MG tablet TAKE 1 TABLET BY MOUTH EVERY DAY  . empagliflozin (JARDIANCE) 25 MG TABS tablet Take 25 mg by mouth daily. Dr Maretta Bees  . liraglutide (VICTOZA) 18 MG/3ML SOPN  Inject 1.8 mg into the skin daily. Dr Maretta Bees  . lisinopril (PRINIVIL,ZESTRIL) 10 MG tablet Take 1 tablet by mouth daily. Dr Honor Junes  . metFORMIN (GLUCOPHAGE) 1000 MG tablet Take 1 tablet (1,000 mg total) by mouth 2 (two) times daily. (Patient taking differently: Take 1,000 mg by mouth 2 (two) times daily. Dr Maretta Bees)  . Omega 3 1000 MG CAPS Take 1 capsule by mouth daily.  . pioglitazone (ACTOS) 15 MG tablet Take 1 tablet by mouth  daily. Dr Honor Junes    [facility] 2/9 Scores 01/08/2017 03/14/2015  PHQ - 2 Score 0 0  PHQ- 9 Score 0 -    Physical Exam  BP 110/80   Pulse 64   Ht 5\' 9"  (1.753 m)   Wt 251 lb (113.9 kg)   BMI 37.07 kg/m   Assessment and Plan:  1. Degenerative disc disease, lumbar Acute persistent./ Initiate meloxicam 15 mg/cyclobenzaprine, and prednisone 10 mg. Call in 2 weeks next step ortho eval - meloxicam (MOBIC) 15 MG tablet; Take 1 tablet (15 mg total) by mouth daily.  Dispense: 30 tablet; Refill: 0 - cyclobenzaprine (FLEXERIL) 10 MG tablet; Take 1 tablet (10 mg total) by mouth at bedtime.  Dispense: 30 tablet; Refill: 0 - predniSONE (DELTASONE) 10 MG tablet; Take 1 tablet (10 mg total) by mouth daily with breakfast.  Dispense: 30 tablet; Refill: 0   Dr. Rhett Najera [facility]  12/31/2017

## 2018-01-09 ENCOUNTER — Other Ambulatory Visit: Payer: Self-pay

## 2018-01-09 ENCOUNTER — Telehealth: Payer: Self-pay

## 2018-01-09 DIAGNOSIS — M5136 Other intervertebral disc degeneration, lumbar region: Secondary | ICD-10-CM

## 2018-01-09 NOTE — Telephone Encounter (Signed)
Pt called wanting to know what can he do about back pain. We sched him for the first available appt on 01/14/18 (Wed) @ 10:30 in [place]/ told to double up on flexeril to bid

## 2018-01-14 DIAGNOSIS — M5137 Other intervertebral disc degeneration, lumbosacral region: Secondary | ICD-10-CM | POA: Diagnosis not present

## 2018-01-14 DIAGNOSIS — M5416 Radiculopathy, lumbar region: Secondary | ICD-10-CM | POA: Diagnosis not present

## 2018-01-14 DIAGNOSIS — G8929 Other chronic pain: Secondary | ICD-10-CM | POA: Diagnosis not present

## 2018-01-14 DIAGNOSIS — M545 Low back pain: Secondary | ICD-10-CM | POA: Diagnosis not present

## 2018-01-14 DIAGNOSIS — M5442 Lumbago with sciatica, left side: Secondary | ICD-10-CM | POA: Diagnosis not present

## 2018-01-19 DIAGNOSIS — R262 Difficulty in walking, not elsewhere classified: Secondary | ICD-10-CM | POA: Diagnosis not present

## 2018-01-19 DIAGNOSIS — M25552 Pain in left hip: Secondary | ICD-10-CM | POA: Diagnosis not present

## 2018-01-21 DIAGNOSIS — M25552 Pain in left hip: Secondary | ICD-10-CM | POA: Diagnosis not present

## 2018-01-21 DIAGNOSIS — R262 Difficulty in walking, not elsewhere classified: Secondary | ICD-10-CM | POA: Diagnosis not present

## 2018-01-23 ENCOUNTER — Other Ambulatory Visit: Payer: Self-pay | Admitting: Family Medicine

## 2018-01-23 DIAGNOSIS — M5136 Other intervertebral disc degeneration, lumbar region: Secondary | ICD-10-CM

## 2018-01-27 DIAGNOSIS — M25552 Pain in left hip: Secondary | ICD-10-CM | POA: Diagnosis not present

## 2018-01-27 DIAGNOSIS — R262 Difficulty in walking, not elsewhere classified: Secondary | ICD-10-CM | POA: Diagnosis not present

## 2018-04-06 DIAGNOSIS — E119 Type 2 diabetes mellitus without complications: Secondary | ICD-10-CM | POA: Diagnosis not present

## 2018-04-06 DIAGNOSIS — I10 Essential (primary) hypertension: Secondary | ICD-10-CM | POA: Diagnosis not present

## 2018-04-06 DIAGNOSIS — E1169 Type 2 diabetes mellitus with other specified complication: Secondary | ICD-10-CM | POA: Diagnosis not present

## 2018-04-06 DIAGNOSIS — E1159 Type 2 diabetes mellitus with other circulatory complications: Secondary | ICD-10-CM | POA: Diagnosis not present

## 2018-04-08 DIAGNOSIS — K080 Exfoliation of teeth due to systemic causes: Secondary | ICD-10-CM | POA: Diagnosis not present

## 2018-04-08 DIAGNOSIS — H5213 Myopia, bilateral: Secondary | ICD-10-CM | POA: Diagnosis not present

## 2018-04-08 DIAGNOSIS — E119 Type 2 diabetes mellitus without complications: Secondary | ICD-10-CM | POA: Diagnosis not present

## 2018-05-07 ENCOUNTER — Encounter: Payer: Self-pay | Admitting: Emergency Medicine

## 2018-05-07 ENCOUNTER — Other Ambulatory Visit: Payer: Self-pay

## 2018-05-07 ENCOUNTER — Ambulatory Visit
Admission: EM | Admit: 2018-05-07 | Discharge: 2018-05-07 | Disposition: A | Discharge status: Home / Self Care | Payer: 59 | Attending: Family Medicine | Admitting: Family Medicine

## 2018-05-07 DIAGNOSIS — J069 Acute upper respiratory infection, unspecified: Secondary | ICD-10-CM | POA: Diagnosis not present

## 2018-05-07 MED ORDER — IPRATROPIUM BROMIDE 0.06 % NA SOLN: 2.0000 | Freq: Four times a day (QID) | NASAL | 0 refills | Status: DC | PRN

## 2018-05-07 NOTE — Discharge Instructions (Signed)
Continue the zyrtec.  Medication as prescribed.  Take care  Dr. Lacinda Axon

## 2018-05-07 NOTE — ED Provider Notes (Signed)
[facility]    CSN: [id number] Arrival date & time: 05/07/18  8546  History   Chief Complaint Chief Complaint  Patient presents with  . Nasal Congestion   HPI  63 year old male presents with concerned that he has a sinus infection.  Patient states symptoms started yesterday.  Reports sinus congestion and postnasal drip.  No documented fever.  No purulent nasal discharge.  He has been taking Zyrtec without resolution.  No known exacerbating factors.  No other associated symptoms.  No other complaints.  PMH, Surgical Hx, Family Hx, Social History reviewed and updated as below.  Past Medical History:  Diagnosis Date  . Diabetes mellitus without complication ([place])   . Hyperlipidemia   . Hypertension    Patient Active Problem List   Diagnosis Date Noted  . Taking medication for chronic disease 01/08/2017  . Special screening for malignant neoplasms, colon   . Benign neoplasm of ascending colon   . Benign neoplasm of descending colon   . Familial multiple lipoprotein-type hyperlipidemia 08/23/2014  . Cerebrovascular accident, impending ([place]) 08/23/2014  . Temporomandibular joint disorders 08/23/2014  . Arthropathy of ankle and foot 08/23/2014  . Aftercare following right knee joint replacement surgery 08/23/2014  . Diabetes mellitus type 2, uncontrolled ([place]) 08/23/2014  . Essential (primary) hypertension 08/23/2014  . Adult BMI 30+ 08/23/2014  . Status post left knee replacement 07/01/2014  . Abnormal stress test 05/25/2014  . HTN (hypertension) 05/25/2014  . Hyperlipemia 05/25/2014  . Obesity 05/25/2014  . Type 2 diabetes mellitus without complication ([place]) 27/05/5007  . Primary osteoarthritis of left knee 05/19/2014  . Primary localized osteoarthrosis, lower leg 06/30/2013   Past Surgical History:  Procedure Laterality Date  . APPENDECTOMY    . COLONOSCOPY  2007   repeat in 10 years  . COLONOSCOPY WITH PROPOFOL N/A 06/27/2016   Procedure: COLONOSCOPY WITH  PROPOFOL;  Surgeon: Lucilla Lame, MD;  Location: [place];  Service: Endoscopy;  Laterality: N/A;  Diabetic - insulin and oral meds  . HERNIA REPAIR     x 2  . KNEE SURGERY Right   . KNEE SURGERY Left   . POLYPECTOMY  06/27/2016   Procedure: POLYPECTOMY;  Surgeon: Lucilla Lame, MD;  Location: [place];  Service: Endoscopy;;  . TONSILLECTOMY     Home Medications    Prior to Admission medications   Medication Sig Start Date End Date Taking? Authorizing Provider  aspirin EC 81 MG tablet Take 1 tablet by mouth daily. 06/15/14  Yes [provider]  atorvastatin (LIPITOR) 20 MG tablet TAKE 1 TABLET BY MOUTH EVERY DAY 11/03/17  Yes Juline Patch, MD  empagliflozin (JARDIANCE) 25 MG TABS tablet Take 25 mg by mouth daily. Dr Maretta Bees 03/08/16  Yes [provider]  liraglutide (VICTOZA) 18 MG/3ML SOPN Inject 1.8 mg into the skin daily. Dr Maretta Bees 02/12/16 05/07/18 Yes [provider]  metFORMIN (GLUCOPHAGE) 1000 MG tablet Take 1 tablet (1,000 mg total) by mouth 2 (two) times daily. Patient taking differently: Take 1,000 mg by mouth 2 (two) times daily. Dr Maretta Bees 03/14/15  Yes Juline Patch, MD  Omega 3 1000 MG CAPS Take 1 capsule by mouth daily.   Yes [provider]  pioglitazone (ACTOS) 15 MG tablet Take 1 tablet by mouth daily. Dr Honor Junes 01/03/17 05/07/18 Yes [provider]  ipratropium (ATROVENT) 0.06 % nasal spray Place 2 sprays into both nostrils 4 (four) times daily as needed for rhinitis. 05/07/18   Coral Spikes, DO  Family History Family History  Problem Relation Age of Onset  . COPD Father     Social History Social History   Tobacco Use  . Smoking status: Former Smoker    Last attempt to quit: 04/01/2006    Years since quitting: 12.1  . Smokeless tobacco: Never Used  Substance Use Topics  . Alcohol use: Yes    Alcohol/week: 0.0 standard drinks    Comment: none recently  . Drug use: No     Allergies   Patient has no  known allergies.   Review of Systems Review of Systems  Constitutional: Negative.   HENT: Positive for congestion, postnasal drip and sinus pain.    Physical Exam Triage Vital Signs ED Triage Vitals  Enc Vitals Group     BP 05/07/18 0849 120/77     Pulse Rate 05/07/18 0849 67     Resp 05/07/18 0849 18     Temp 05/07/18 0849 98.2 F (36.8 C)     Temp Source 05/07/18 0849 Oral     SpO2 05/07/18 0849 96 %     Weight 05/07/18 0847 250 lb (113.4 kg)     Height 05/07/18 0847 5\' 9"  (1.753 m)     Head Circumference --      Peak Flow --      Pain Score 05/07/18 0846 0     Pain Loc --      Pain Edu? --      Excl. in [place]? --    Updated Vital Signs BP 120/77 (BP Location: Right Arm)   Pulse 67   Temp 98.2 F (36.8 C) (Oral)   Resp 18   Ht 5\' 9"  (1.753 m)   Wt 113.4 kg   SpO2 96%   BMI 36.92 kg/m   Visual Acuity Right Eye Distance:   Left Eye Distance:   Bilateral Distance:    Right Eye Near:   Left Eye Near:    Bilateral Near:     Physical Exam Vitals signs and nursing note reviewed.  Constitutional:      Appearance: Normal appearance.  HENT:     Head: Normocephalic and atraumatic.     Right Ear: Tympanic membrane normal.     Left Ear: Tympanic membrane normal.     Nose: Nose normal. No rhinorrhea.     Mouth/Throat:     Pharynx: Oropharynx is clear.     Comments: Oropharynx with mild erythema.  Eyes:     General:        Right eye: No discharge.        Left eye: No discharge.     Conjunctiva/sclera: Conjunctivae normal.  Cardiovascular:     Rate and Rhythm: Normal rate and regular rhythm.  Pulmonary:     Effort: Pulmonary effort is normal.     Breath sounds: Normal breath sounds.  Neurological:     Mental Status: He is alert.  Psychiatric:        Mood and Affect: Mood normal.        Behavior: Behavior normal.    UC Treatments / Results  Labs (all labs ordered are listed, but only abnormal results are displayed) Labs Reviewed - No data to  display  EKG None  Radiology No results found.  Procedures Procedures (including critical care time)  Medications Ordered in UC Medications - No data to display  Initial Impression / Assessment and Plan / UC Course  I have reviewed the triage vital signs and the nursing notes.  Pertinent  labs & imaging results that were available during my care of the patient were reviewed by me and considered in my medical decision making (see chart for details).    62 year old male presents with a viral URI.  Atrovent nasal spray as prescribed.  Over-the-counter antihistamine. Supportive care.  Final Clinical Impressions(s) / UC Diagnoses   Final diagnoses:  Viral URI     Discharge Instructions     Continue the zyrtec.  Medication as prescribed.  Take care  Dr. Lacinda Axon    ED Prescriptions    Medication Sig Dispense Auth. Provider   ipratropium (ATROVENT) 0.06 % nasal spray Place 2 sprays into both nostrils 4 (four) times daily as needed for rhinitis. 15 mL Coral Spikes, DO     Controlled Substance Prescriptions [place] Controlled Substance Registry consulted? Not Applicable   Coral Spikes, [place] 05/07/18 [id number]

## 2018-05-07 NOTE — ED Triage Notes (Signed)
Patient c/o sinus congestion and pain that started yesterday. He has been taking Zyrtec since yesterday.

## 2018-10-09 DIAGNOSIS — E1169 Type 2 diabetes mellitus with other specified complication: Secondary | ICD-10-CM | POA: Diagnosis not present

## 2018-10-09 DIAGNOSIS — E119 Type 2 diabetes mellitus without complications: Secondary | ICD-10-CM | POA: Diagnosis not present

## 2018-10-09 DIAGNOSIS — E1159 Type 2 diabetes mellitus with other circulatory complications: Secondary | ICD-10-CM | POA: Diagnosis not present

## 2018-10-09 DIAGNOSIS — I10 Essential (primary) hypertension: Secondary | ICD-10-CM | POA: Diagnosis not present

## 2018-11-05 ENCOUNTER — Telehealth: Payer: Self-pay

## 2018-11-05 NOTE — Telephone Encounter (Signed)
Please call pt to schedule appt for med refill.  thankyou.

## 2018-11-05 NOTE — Telephone Encounter (Signed)
LM to confirm-ah

## 2019-02-05 DIAGNOSIS — E1169 Type 2 diabetes mellitus with other specified complication: Secondary | ICD-10-CM | POA: Diagnosis not present

## 2019-02-05 DIAGNOSIS — E119 Type 2 diabetes mellitus without complications: Secondary | ICD-10-CM | POA: Diagnosis not present

## 2019-02-05 DIAGNOSIS — I10 Essential (primary) hypertension: Secondary | ICD-10-CM | POA: Diagnosis not present

## 2019-02-05 DIAGNOSIS — E1159 Type 2 diabetes mellitus with other circulatory complications: Secondary | ICD-10-CM | POA: Diagnosis not present

## 2019-04-21 NOTE — Telephone Encounter (Signed)
Diabetes medication is prescribed by Dr Raechel Chute at [facility], patient does not think he needs to see Dr Ronnald Ramp for his medicines.

## 2019-04-21 NOTE — Telephone Encounter (Signed)
Dr Ronnald Ramp is still prescribing his Atorvastatin- which means we have to follow him every 6 months and monitor his liver. Please explain that to pt and get him on schedule, unless he wants to see if Dr Honor Junes will do ALL his meds

## 2019-04-22 NOTE — Telephone Encounter (Signed)
LM to inform patient

## 2019-06-25 DIAGNOSIS — I10 Essential (primary) hypertension: Secondary | ICD-10-CM | POA: Diagnosis not present

## 2019-06-25 DIAGNOSIS — E1159 Type 2 diabetes mellitus with other circulatory complications: Secondary | ICD-10-CM | POA: Diagnosis not present

## 2019-06-25 DIAGNOSIS — E119 Type 2 diabetes mellitus without complications: Secondary | ICD-10-CM | POA: Diagnosis not present

## 2019-06-25 DIAGNOSIS — E1169 Type 2 diabetes mellitus with other specified complication: Secondary | ICD-10-CM | POA: Diagnosis not present

## 2019-06-25 LAB — HEMOGLOBIN A1C: Hemoglobin A1C: 6.4

## 2019-07-26 ENCOUNTER — Ambulatory Visit: Payer: Commercial Indemnity | Attending: Internal Medicine

## 2019-07-26 DIAGNOSIS — Z23 Encounter for immunization: Secondary | ICD-10-CM

## 2019-07-26 NOTE — Progress Notes (Signed)
   [id number]  Name:  Darius Brown.    MRN: [id number] DOB: [birth year]  07/26/2019  [name] was observed post Covid-19 immunization for 15 minutes without incident. He was provided with Vaccine Information Sheet and instruction to access the V-Safe system.   [name] was instructed to call 911 with any severe reactions post vaccine: Marland Kitchen Difficulty breathing  . Swelling of face and throat  . A fast heartbeat  . A bad rash all over body  . Dizziness and weakness   Immunizations Administered    Name Date Dose VIS Date Route   Pfizer COVID-19 Vaccine 07/26/2019 11:41 AM 0.3 mL 05/26/2018 Intramuscular   Manufacturer: [place]   Lot: [id number]   [place]: [id number]

## 2019-08-17 ENCOUNTER — Ambulatory Visit: Payer: Commercial Indemnity | Attending: Internal Medicine

## 2019-08-17 DIAGNOSIS — Z23 Encounter for immunization: Secondary | ICD-10-CM

## 2019-08-17 NOTE — Progress Notes (Signed)
   [id number]  Name:  Darius Brown.    MRN: [id number] DOB: [birth year]  08/17/2019  [name] was observed post Covid-19 immunization for 15 minutes without incident. He was provided with Vaccine Information Sheet and instruction to access the V-Safe system.   [name] was instructed to call 911 with any severe reactions post vaccine: Marland Kitchen Difficulty breathing  . Swelling of face and throat  . A fast heartbeat  . A bad rash all over body  . Dizziness and weakness   Immunizations Administered    Name Date Dose VIS Date Route   Pfizer COVID-19 Vaccine 08/17/2019 10:59 AM 0.3 mL 05/26/2018 Intramuscular   Manufacturer: [place]   Lot: [id number]   [place]: [id number]

## 2019-11-22 ENCOUNTER — Encounter: Payer: Self-pay | Admitting: Family Medicine

## 2019-11-22 ENCOUNTER — Other Ambulatory Visit: Payer: Self-pay

## 2019-11-22 ENCOUNTER — Ambulatory Visit (INDEPENDENT_AMBULATORY_CARE_PROVIDER_SITE_OTHER): Payer: Commercial Indemnity | Admitting: Family Medicine

## 2019-11-22 VITALS — BP 130/80 | HR 72 | Temp 99.0°F | Ht 69.0 in | Wt 258.0 lb

## 2019-11-22 DIAGNOSIS — J019 Acute sinusitis, unspecified: Secondary | ICD-10-CM

## 2019-11-22 MED ORDER — AZITHROMYCIN 250 MG PO TABS: ORAL_TABLET | ORAL | 0 refills | Status: DC

## 2019-11-22 NOTE — Progress Notes (Signed)
Date:  11/22/2019   Name:  Darius Brown.   DOB:  Feb 24, 1956   MRN:  [id number]   Chief Complaint: Sinusitis (drainage started in back of throat with a tickle. Pressure in cheeks and burning in nose. dry cough)  Sinusitis This is a new problem. The current episode started in the past 7 days. The problem has been gradually worsening since onset. There has been no fever. He is experiencing no pain. Associated symptoms include congestion, sinus pressure and a sore throat. Pertinent negatives include no chills, coughing, diaphoresis, ear pain, headaches, hoarse voice, neck pain, shortness of breath, sneezing or swollen glands. (Postnasal drainage) Past treatments include oral decongestants. The treatment provided mild relief.    Lab Results  Component Value Date   CREATININE 0.92 03/21/2016   BUN 16 03/21/2016   NA 140 03/21/2016   K 5.2 03/21/2016   CL 100 03/21/2016   CO2 20 03/21/2016   Lab Results  Component Value Date   CHOL 140 03/21/2016   HDL 40 03/21/2016   LDLCALC 76 03/21/2016   TRIG 120 03/21/2016   CHOLHDL 3.5 03/21/2016   No results found for: TSH Lab Results  Component Value Date   HGBA1C 6.4 06/25/2019   Lab Results  Component Value Date   WBC 7.0 10/19/2011   HGB 13.7 10/19/2011   HCT 41.0 10/19/2011   MCV 96 10/19/2011   PLT 235 10/19/2011   Lab Results  Component Value Date   ALT 28 04/21/2014   AST 22 04/21/2014   ALKPHOS 46 (L) 10/19/2011   BILITOT 1.1 (H) 10/19/2011     Review of Systems  Constitutional: Negative for chills, diaphoresis and fever.  HENT: Positive for congestion, sinus pressure and sore throat. Negative for drooling, ear discharge, ear pain, hoarse voice, postnasal drip, rhinorrhea and sneezing.   Respiratory: Negative for cough, shortness of breath and wheezing.   Cardiovascular: Negative for chest pain, palpitations and leg swelling.  Gastrointestinal: Negative for abdominal pain, blood in stool, constipation,  diarrhea and nausea.  Endocrine: Negative for polydipsia.  Genitourinary: Negative for dysuria, frequency, hematuria and urgency.  Musculoskeletal: Negative for back pain, myalgias and neck pain.  Skin: Negative for rash.  Allergic/Immunologic: Negative for environmental allergies.  Neurological: Negative for dizziness and headaches.  Hematological: Does not bruise/bleed easily.  Psychiatric/Behavioral: Negative for suicidal ideas. The patient is not nervous/anxious.     Patient Active Problem List   Diagnosis Date Noted  . Taking medication for chronic disease 01/08/2017  . Special screening for malignant neoplasms, colon   . Benign neoplasm of ascending colon   . Benign neoplasm of descending colon   . Familial multiple lipoprotein-type hyperlipidemia 08/23/2014  . Cerebrovascular accident, impending ([place]) 08/23/2014  . Temporomandibular joint disorders 08/23/2014  . Arthropathy of ankle and foot 08/23/2014  . Aftercare following right knee joint replacement surgery 08/23/2014  . Diabetes mellitus type 2, uncontrolled ([place]) 08/23/2014  . Essential (primary) hypertension 08/23/2014  . Adult BMI 30+ 08/23/2014  . Status post left knee replacement 07/01/2014  . Abnormal stress test 05/25/2014  . HTN (hypertension) 05/25/2014  . Hyperlipemia 05/25/2014  . Obesity 05/25/2014  . Type 2 diabetes mellitus without complication ([place]) 97/94/8016  . Primary osteoarthritis of left knee 05/19/2014  . Primary localized osteoarthrosis, lower leg 06/30/2013    No Known Allergies  Past Surgical History:  Procedure Laterality Date  . APPENDECTOMY    . COLONOSCOPY  2007   repeat in 10 years  .  COLONOSCOPY WITH PROPOFOL N/A 06/27/2016   Procedure: COLONOSCOPY WITH PROPOFOL;  Surgeon: Lucilla Lame, MD;  Location: [place];  Service: Endoscopy;  Laterality: N/A;  Diabetic - insulin and oral meds  . HERNIA REPAIR     x 2  . KNEE SURGERY Right   . KNEE SURGERY Left   . POLYPECTOMY   06/27/2016   Procedure: POLYPECTOMY;  Surgeon: Lucilla Lame, MD;  Location: [place];  Service: Endoscopy;;  . TONSILLECTOMY      Social History   Tobacco Use  . Smoking status: Former Smoker    Quit date: 04/01/2006    Years since quitting: 13.6  . Smokeless tobacco: Never Used  Vaping Use  . Vaping Use: Never used  Substance Use Topics  . Alcohol use: Yes    Alcohol/week: 0.0 standard drinks    Comment: none recently  . Drug use: No     Medication list has been reviewed and updated.  Current Meds  Medication Sig  . aspirin EC 81 MG tablet Take 1 tablet by mouth daily.  Marland Kitchen atorvastatin (LIPITOR) 20 MG tablet TAKE 1 TABLET BY MOUTH EVERY DAY  . empagliflozin (JARDIANCE) 25 MG TABS tablet Take 25 mg by mouth daily. Dr Maretta Bees  . lisinopril (ZESTRIL) 10 MG tablet Take 1 tablet by mouth daily.  . metFORMIN (GLUCOPHAGE) 1000 MG tablet Take 1 tablet (1,000 mg total) by mouth 2 (two) times daily. (Patient taking differently: Take 1,000 mg by mouth 2 (two) times daily. Dr Maretta Bees)  . Omega 3 1000 MG CAPS Take 1 capsule by mouth daily.  . pioglitazone (ACTOS) 15 MG tablet Take 1 tablet by mouth daily. Dr Honor Junes  . Semaglutide,0.25 or 0.5MG /DOS, (OZEMPIC, 0.25 OR 0.5 MG/DOSE,) 2 MG/1.5ML SOPN Inject into the skin. Dr Honor Junes    [facility] 2/9 Scores 11/22/2019 01/08/2017 03/14/2015  PHQ - 2 Score 0 0 0  PHQ- 9 Score 0 0 -    GAD 7 : Generalized Anxiety Score 11/22/2019  Nervous, Anxious, on Edge 0  Control/stop worrying 0  Worry too much - different things 0  Trouble relaxing 0  Restless 0  Easily annoyed or irritable 0  Afraid - awful might happen 0  Total GAD 7 Score 0    BP Readings from Last 3 Encounters:  11/22/19 130/80  05/07/18 120/77  12/31/17 110/80    Physical Exam Vitals and nursing note reviewed.  HENT:     Head: Normocephalic.     Right Ear: Tympanic membrane, ear canal and external ear normal.     Left Ear: Ear canal and external ear normal. A middle  ear effusion is present.     Nose: Congestion and rhinorrhea present.     Right Turbinates: Swollen.     Left Turbinates: Swollen.     Right Sinus: No maxillary sinus tenderness or frontal sinus tenderness.     Left Sinus: No maxillary sinus tenderness or frontal sinus tenderness.     Mouth/Throat:     Lips: Pink.     Mouth: Mucous membranes are moist.  Eyes:     General: No scleral icterus.       Right eye: No discharge.        Left eye: No discharge.     Conjunctiva/sclera: Conjunctivae normal.     Pupils: Pupils are equal, round, and reactive to light.  Neck:     Thyroid: No thyromegaly.     Vascular: No JVD.     Trachea: No tracheal deviation.  Cardiovascular:     Rate and Rhythm: Normal rate and regular rhythm.     Heart sounds: Normal heart sounds. No murmur heard.  No friction rub. No gallop.   Pulmonary:     Effort: No respiratory distress.     Breath sounds: Normal breath sounds. No decreased breath sounds, wheezing, rhonchi or rales.  Abdominal:     General: Bowel sounds are normal.     Palpations: Abdomen is soft. There is no mass.     Tenderness: There is no abdominal tenderness. There is no guarding or rebound.  Musculoskeletal:        General: No tenderness. Normal range of motion.     Cervical back: Normal range of motion and neck supple.  Lymphadenopathy:     Cervical: No cervical adenopathy.  Skin:    General: Skin is warm.     Findings: No rash.  Neurological:     Mental Status: He is alert and oriented to person, place, and time.     Cranial Nerves: No cranial nerve deficit.     Deep Tendon Reflexes: Reflexes are normal and symmetric.     Wt Readings from Last 3 Encounters:  11/22/19 258 lb (117 kg)  05/07/18 250 lb (113.4 kg)  12/31/17 251 lb (113.9 kg)    BP 130/80   Pulse 72   Temp 99 F (37.2 C) (Oral)   Ht 5\' 9"  (1.753 m)   Wt 258 lb (117 kg)   SpO2 97%   BMI 38.10 kg/m   Assessment and Plan:  1. Acute sinusitis, recurrence not  specified, unspecified location Acute.  Persistent.  Stable.  Patient has had gradually worsening of his upper respiratory infection over the past several days.  There is a low-grade fever but no other associated symptoms such as cough, shortness of breath, myalgias, or distortion of taste and smell.  Patient has been instructed that he will need to be checked for Covid if any of these should develop and become persistent or increasing temperature.  We will initiate a azithromycin to 50 mg 2 today followed by 1 a day for 4 days. - azithromycin (ZITHROMAX) 250 MG tablet; 2 tablets today then 1 a day for 4 days  Dispense: 6 tablet; Refill: 0

## 2020-03-08 LAB — HM DIABETES EYE EXAM

## 2020-03-13 DIAGNOSIS — E1169 Type 2 diabetes mellitus with other specified complication: Secondary | ICD-10-CM | POA: Diagnosis not present

## 2020-03-13 DIAGNOSIS — E1159 Type 2 diabetes mellitus with other circulatory complications: Secondary | ICD-10-CM | POA: Diagnosis not present

## 2020-03-13 DIAGNOSIS — I152 Hypertension secondary to endocrine disorders: Secondary | ICD-10-CM | POA: Diagnosis not present

## 2020-03-13 DIAGNOSIS — E785 Hyperlipidemia, unspecified: Secondary | ICD-10-CM | POA: Diagnosis not present

## 2020-03-13 LAB — HEMOGLOBIN A1C: Hemoglobin A1C: 6.7

## 2020-03-14 ENCOUNTER — Other Ambulatory Visit: Payer: Self-pay

## 2020-03-14 ENCOUNTER — Encounter: Payer: Self-pay | Admitting: Family Medicine

## 2020-03-14 ENCOUNTER — Ambulatory Visit (INDEPENDENT_AMBULATORY_CARE_PROVIDER_SITE_OTHER): Payer: Commercial Indemnity | Admitting: Family Medicine

## 2020-03-14 VITALS — BP 120/90 | HR 74 | Ht 69.0 in | Wt 258.0 lb

## 2020-03-14 DIAGNOSIS — I4891 Unspecified atrial fibrillation: Secondary | ICD-10-CM

## 2020-03-14 DIAGNOSIS — I499 Cardiac arrhythmia, unspecified: Secondary | ICD-10-CM | POA: Diagnosis not present

## 2020-03-14 NOTE — Patient Instructions (Signed)

## 2020-03-14 NOTE — Progress Notes (Signed)
Date:  03/14/2020   Name:  Darius Brown.   DOB:  October 16, 1955   MRN:  [id number]   Chief Complaint: Irregular Heart Beat (Dr Honor Junes detected irregular heartbeat and wants EKG) and Shoulder Pain  Heart Problem This is a new problem. The current episode started yesterday. Pertinent negatives include no abdominal pain, arthralgias, chest pain, chills, coughing, fatigue, fever, headaches, myalgias, nausea, neck pain, rash, sore throat, swollen glands or weakness. Nothing aggravates the symptoms. He has tried nothing for the symptoms.    Lab Results  Component Value Date   CREATININE 0.92 03/21/2016   BUN 16 03/21/2016   NA 140 03/21/2016   K 5.2 03/21/2016   CL 100 03/21/2016   CO2 20 03/21/2016   Lab Results  Component Value Date   CHOL 140 03/21/2016   HDL 40 03/21/2016   LDLCALC 76 03/21/2016   TRIG 120 03/21/2016   CHOLHDL 3.5 03/21/2016   No results found for: TSH Lab Results  Component Value Date   HGBA1C 6.7 03/13/2020   Lab Results  Component Value Date   WBC 7.0 10/19/2011   HGB 13.7 10/19/2011   HCT 41.0 10/19/2011   MCV 96 10/19/2011   PLT 235 10/19/2011   Lab Results  Component Value Date   ALT 28 04/21/2014   AST 22 04/21/2014   ALKPHOS 46 (L) 10/19/2011   BILITOT 1.1 (H) 10/19/2011     Review of Systems  Constitutional: Negative for chills, fatigue and fever.  HENT: Negative for drooling, ear discharge, ear pain, nosebleeds, postnasal drip and sore throat.   Respiratory: Negative for cough, shortness of breath and wheezing.   Cardiovascular: Negative for chest pain, palpitations and leg swelling.  Gastrointestinal: Negative for abdominal distention, abdominal pain, anal bleeding, blood in stool, constipation, diarrhea and nausea.  Endocrine: Negative for polydipsia.  Genitourinary: Negative for dysuria, frequency, hematuria, scrotal swelling and urgency.  Musculoskeletal: Negative for arthralgias, back pain, myalgias and neck pain.   Skin: Negative for rash.  Allergic/Immunologic: Negative for environmental allergies.  Neurological: Negative for dizziness, tremors, syncope, weakness and headaches.  Hematological: Does not bruise/bleed easily.  Psychiatric/Behavioral: Negative for suicidal ideas. The patient is not nervous/anxious.     Patient Active Problem List   Diagnosis Date Noted  . Taking medication for chronic disease 01/08/2017  . Special screening for malignant neoplasms, colon   . Benign neoplasm of ascending colon   . Benign neoplasm of descending colon   . Familial multiple lipoprotein-type hyperlipidemia 08/23/2014  . Cerebrovascular accident, impending ([place]) 08/23/2014  . Temporomandibular joint disorders 08/23/2014  . Arthropathy of ankle and foot 08/23/2014  . Aftercare following right knee joint replacement surgery 08/23/2014  . Diabetes mellitus type 2, uncontrolled ([place]) 08/23/2014  . Essential (primary) hypertension 08/23/2014  . Adult BMI 30+ 08/23/2014  . Status post left knee replacement 07/01/2014  . Abnormal stress test 05/25/2014  . HTN (hypertension) 05/25/2014  . Hyperlipemia 05/25/2014  . Obesity 05/25/2014  . Type 2 diabetes mellitus without complication ([place]) 21/22/4825  . Primary osteoarthritis of left knee 05/19/2014  . Primary localized osteoarthrosis, lower leg 06/30/2013    No Known Allergies  Past Surgical History:  Procedure Laterality Date  . APPENDECTOMY    . COLONOSCOPY  2007   repeat in 10 years  . COLONOSCOPY WITH PROPOFOL N/A 06/27/2016   Procedure: COLONOSCOPY WITH PROPOFOL;  Surgeon: Lucilla Lame, MD;  Location: [facility];  Service: Endoscopy;  Laterality: N/A;  Diabetic - insulin and  oral meds  . HERNIA REPAIR     x 2  . KNEE SURGERY Right   . KNEE SURGERY Left   . POLYPECTOMY  06/27/2016   Procedure: POLYPECTOMY;  Surgeon: Lucilla Lame, MD;  Location: [place] [facility];  Service: Endoscopy;;  . TONSILLECTOMY      Social History   Tobacco  Use  . Smoking status: Former Smoker    Quit date: 04/01/2006    Years since quitting: 13.9  . Smokeless tobacco: Never Used  Vaping Use  . Vaping Use: Never used  Substance Use Topics  . Alcohol use: Yes    Alcohol/week: 0.0 standard drinks    Comment: none recently  . Drug use: No     Medication list has been reviewed and updated.  Current Meds  Medication Sig  . aspirin EC 81 MG tablet Take 1 tablet by mouth daily.  Marland Kitchen atorvastatin (LIPITOR) 20 MG tablet TAKE 1 TABLET BY MOUTH EVERY DAY  . empagliflozin (JARDIANCE) 25 MG TABS tablet Take 25 mg by mouth daily. Dr Maretta Bees  . gabapentin (NEURONTIN) 300 MG capsule Take 300 mg by mouth at bedtime. O'connell  . lisinopril (ZESTRIL) 10 MG tablet Take 1 tablet by mouth daily.  . metFORMIN (GLUCOPHAGE) 1000 MG tablet Take 1 tablet (1,000 mg total) by mouth 2 (two) times daily. (Patient taking differently: Take 1,000 mg by mouth 2 (two) times daily. Dr Maretta Bees)  . Omega 3 1000 MG CAPS Take 1 capsule by mouth daily.  . pioglitazone (ACTOS) 15 MG tablet Take 1 tablet by mouth daily. Dr Honor Junes  . Semaglutide,0.25 or 0.5MG/DOS, (OZEMPIC, 0.25 OR 0.5 MG/DOSE,) 2 MG/1.5ML SOPN Inject into the skin. Dr Honor Junes    [facility] 2/9 Scores 11/22/2019 01/08/2017 03/14/2015  PHQ - 2 Score 0 0 0  PHQ- 9 Score 0 0 -    GAD 7 : Generalized Anxiety Score 11/22/2019  Nervous, Anxious, on Edge 0  Control/stop worrying 0  Worry too much - different things 0  Trouble relaxing 0  Restless 0  Easily annoyed or irritable 0  Afraid - awful might happen 0  Total GAD 7 Score 0    BP Readings from Last 3 Encounters:  03/14/20 120/90  11/22/19 130/80  05/07/18 120/77    Physical Exam Vitals and nursing note reviewed.  HENT:     Head: Normocephalic.     Right Ear: Tympanic membrane and external ear normal. There is no impacted cerumen.     Left Ear: Tympanic membrane and external ear normal. There is no impacted cerumen.     Nose: Congestion present.      Mouth/Throat:     Mouth: Oropharynx is clear and moist. Mucous membranes are moist.  Eyes:     General: No scleral icterus.       Right eye: No discharge.        Left eye: No discharge.     Extraocular Movements: EOM normal.     Conjunctiva/sclera: Conjunctivae normal.     Pupils: Pupils are equal, round, and reactive to light.  Neck:     Thyroid: No thyromegaly.     Vascular: No JVD.     Trachea: No tracheal deviation.  Cardiovascular:     Rate and Rhythm: Normal rate. Rhythm irregularly irregular.     Chest Wall: PMI is not displaced.     Pulses: Normal pulses and intact distal pulses.     Heart sounds: Normal heart sounds, S1 normal and S2 normal. No murmur heard.  No systolic murmur is present.  No diastolic murmur is present. No friction rub. No gallop. No S3 or S4 sounds.   Pulmonary:     Effort: No respiratory distress.     Breath sounds: Normal breath sounds. No wheezing, rhonchi or rales.  Abdominal:     General: Bowel sounds are normal. There is no distension.     Palpations: Abdomen is soft. There is no hepatosplenomegaly or mass.     Tenderness: There is no abdominal tenderness. There is no CVA tenderness, right CVA tenderness, left CVA tenderness, guarding or rebound.     Hernia: No hernia is present.  Musculoskeletal:        General: No tenderness or edema. Normal range of motion.     Cervical back: Normal range of motion and neck supple.     Right lower leg: No edema.     Left lower leg: No edema.  Lymphadenopathy:     Cervical: No cervical adenopathy.  Skin:    General: Skin is warm.     Findings: No rash.  Neurological:     Mental Status: He is alert and oriented to person, place, and time.     Cranial Nerves: No cranial nerve deficit.     Deep Tendon Reflexes: Strength normal and reflexes are normal and symmetric.     Wt Readings from Last 3 Encounters:  03/14/20 258 lb (117 kg)  11/22/19 258 lb (117 kg)  05/07/18 250 lb (113.4 kg)    BP 120/90    Pulse 74   Ht _0  (1.753 m)   Wt 258 lb (117 kg)   BMI 38.10 kg/m   Assessment and Plan:  1. Atrial fibrillation, new onset ([place]) New onset.  Uncontrolled.  Stable.  The patient had an appointment with endocrinology which noted the patient was then an irregular irregular rhythm.  Patient is asymptomatic without dyspnea, palpitations, and chest discomfort.  EKG was done:I have reviewed EKG which shows: Rate 100/irregular.  No P waves noted.  QT QRS normal intervals.  No voltage criteria met for  hypertrophy.  No ischemic changes noted.  There is an incomplete right bundle branch block noted.. Comparison to previous EKG dated 10/18/2011 unremarkable.  Cardiology consult was made with Dr. Earnie Larsson on Thursday.  Patient has been instructed if he goes into rapid heartbeat to call or to go to the nearest emergency room.  2. Irregular heart beat New onset.  Hemodynamically stable.  Patient was noted to have an irregular heartbeat at endocrinology at which time patient was sent to PCP was seen the next day and evaluation was conducted.  Patient was asymptomatic.  EKG was ordered for evaluation. - EKG 12-Lead

## 2020-03-15 DIAGNOSIS — E1169 Type 2 diabetes mellitus with other specified complication: Secondary | ICD-10-CM | POA: Diagnosis not present

## 2020-03-15 DIAGNOSIS — E785 Hyperlipidemia, unspecified: Secondary | ICD-10-CM | POA: Diagnosis not present

## 2020-03-15 DIAGNOSIS — I152 Hypertension secondary to endocrine disorders: Secondary | ICD-10-CM | POA: Diagnosis not present

## 2020-03-15 DIAGNOSIS — E1159 Type 2 diabetes mellitus with other circulatory complications: Secondary | ICD-10-CM | POA: Diagnosis not present

## 2020-03-15 DIAGNOSIS — E119 Type 2 diabetes mellitus without complications: Secondary | ICD-10-CM | POA: Diagnosis not present

## 2020-03-16 DIAGNOSIS — Z7689 Persons encountering health services in other specified circumstances: Secondary | ICD-10-CM | POA: Diagnosis not present

## 2020-03-16 DIAGNOSIS — I480 Paroxysmal atrial fibrillation: Secondary | ICD-10-CM | POA: Diagnosis not present

## 2020-03-16 DIAGNOSIS — R0609 Other forms of dyspnea: Secondary | ICD-10-CM | POA: Insufficient documentation

## 2020-03-16 DIAGNOSIS — I4891 Unspecified atrial fibrillation: Secondary | ICD-10-CM | POA: Diagnosis not present

## 2020-03-16 DIAGNOSIS — E1159 Type 2 diabetes mellitus with other circulatory complications: Secondary | ICD-10-CM | POA: Diagnosis not present

## 2020-03-16 DIAGNOSIS — R0602 Shortness of breath: Secondary | ICD-10-CM | POA: Insufficient documentation

## 2020-04-01 ENCOUNTER — Ambulatory Visit
Admission: EM | Admit: 2020-04-01 | Discharge: 2020-04-01 | Disposition: A | Discharge status: Home / Self Care | Payer: 59 | Attending: Emergency Medicine | Admitting: Emergency Medicine

## 2020-04-01 ENCOUNTER — Encounter: Payer: Self-pay | Admitting: Emergency Medicine

## 2020-04-01 ENCOUNTER — Other Ambulatory Visit: Payer: Self-pay

## 2020-04-01 DIAGNOSIS — J069 Acute upper respiratory infection, unspecified: Secondary | ICD-10-CM | POA: Diagnosis not present

## 2020-04-01 MED ORDER — BENZONATATE 100 MG PO CAPS
200.0000 mg | ORAL_CAPSULE | Freq: Three times a day (TID) | ORAL | 0 refills | Status: DC
Start: 2020-04-01 — End: 2021-02-04

## 2020-04-01 MED ORDER — PROMETHAZINE-DM 6.25-15 MG/5ML PO SYRP: 5.0000 mL | ORAL_SOLUTION | Freq: Four times a day (QID) | ORAL | 0 refills | Status: DC | PRN

## 2020-04-01 MED ORDER — AZITHROMYCIN 250 MG PO TABS: 250.0000 mg | ORAL_TABLET | Freq: Every day | ORAL | 0 refills | Status: DC

## 2020-04-01 NOTE — ED Triage Notes (Signed)
Patient c/o nasal congestion, cough that started yesterday.

## 2020-04-01 NOTE — Discharge Instructions (Addendum)
Take the Z-Pak as instructed on the package insert.  There will be 2 tablets on day 1 and 1 tablet days 2 through 5.  Take the [facility] during the day as needed for cough.  Take them with a small sip of water.  They may give you some numbness to the base of your tongue or metallic taste in your mouth, this is normal.  Use the Promethazine DM cough syrup at night for cough, congestion, and sleep as it make you drowsy.  Perform sinus irrigation 2-3 times a day with a [employer] sinus rinse kit and distilled water.  Do not use tap water.  If your symptoms persist, or new symptoms develop, follow-up with your primary care provider.

## 2020-04-01 NOTE — ED Provider Notes (Signed)
[facility]    CSN: [id number] Arrival date & time: 04/01/20  [id number]      History   Chief Complaint Chief Complaint  Patient presents with  . Nasal Congestion  . Cough    HPI Darius Brown. is a 65 y.o. male.   HPI   65 year old male here for evaluation of nasal congestion and cough that started yesterday.  Patient states that he is not having a lot of nasal discharge but having more postnasal drip which is contributing to a sore throat, mild headache and a nonproductive cough.  Patient denies runny nose, sinus pain or pressure, shortness of breath or wheezing, GI complaints, body aches, or changes to sense of taste or smell.  Patient has had his Covid vaccine but not the booster shot.  Patient got his flu vaccine.  Patient states that he gets something of this every year and is typically treated with cough medicine and a Z-Pak by his primary care provider.  Past Medical History:  Diagnosis Date  . Diabetes mellitus without complication ([place])   . Hyperlipidemia   . Hypertension     Patient Active Problem List   Diagnosis Date Noted  . Taking medication for chronic disease 01/08/2017  . Special screening for malignant neoplasms, colon   . Benign neoplasm of ascending colon   . Benign neoplasm of descending colon   . Familial multiple lipoprotein-type hyperlipidemia 08/23/2014  . Cerebrovascular accident, impending ([place]) 08/23/2014  . Temporomandibular joint disorders 08/23/2014  . Arthropathy of ankle and foot 08/23/2014  . Aftercare following right knee joint replacement surgery 08/23/2014  . Diabetes mellitus type 2, uncontrolled ([place]) 08/23/2014  . Essential (primary) hypertension 08/23/2014  . Adult BMI 30+ 08/23/2014  . Status post left knee replacement 07/01/2014  . Abnormal stress test 05/25/2014  . HTN (hypertension) 05/25/2014  . Hyperlipemia 05/25/2014  . Obesity 05/25/2014  . Type 2 diabetes mellitus without complication ([place]) 05/24/3610   . Primary osteoarthritis of left knee 05/19/2014  . Primary localized osteoarthrosis, lower leg 06/30/2013    Past Surgical History:  Procedure Laterality Date  . APPENDECTOMY    . COLONOSCOPY  2007   repeat in 10 years  . COLONOSCOPY WITH PROPOFOL N/A 06/27/2016   Procedure: COLONOSCOPY WITH PROPOFOL;  Surgeon: Lucilla Lame, MD;  Location: [facility];  Service: Endoscopy;  Laterality: N/A;  Diabetic - insulin and oral meds  . HERNIA REPAIR     x 2  . KNEE SURGERY Right   . KNEE SURGERY Left   . POLYPECTOMY  06/27/2016   Procedure: POLYPECTOMY;  Surgeon: Lucilla Lame, MD;  Location: [facility];  Service: Endoscopy;;  . TONSILLECTOMY         Home Medications    Prior to Admission medications   Medication Sig Start Date End Date Taking? Authorizing Provider  aspirin EC 81 MG tablet Take 1 tablet by mouth daily. 06/15/14  Yes [provider]  atorvastatin (LIPITOR) 20 MG tablet TAKE 1 TABLET BY MOUTH EVERY DAY 11/03/17  Yes Juline Patch, MD  azithromycin (ZITHROMAX Z-PAK) 250 MG tablet Take 1 tablet (250 mg total) by mouth daily. Take 2 tablets on day 1 and 1 tablet days 2 through 5. 04/01/20  Yes Margarette Canada, NP  benzonatate (TESSALON) 100 MG capsule Take 2 capsules (200 mg total) by mouth every 8 (eight) hours. 04/01/20  Yes Margarette Canada, NP  empagliflozin (JARDIANCE) 25 MG TABS tablet Take 25 mg by mouth daily. Dr Maretta Bees  03/08/16  Yes [provider]  gabapentin (NEURONTIN) 300 MG capsule Take 300 mg by mouth at bedtime. O'connell   Yes [provider]  lisinopril (ZESTRIL) 10 MG tablet Take 1 tablet by mouth daily. 11/12/19  Yes [provider]  metFORMIN (GLUCOPHAGE) 1000 MG tablet Take 1 tablet (1,000 mg total) by mouth 2 (two) times daily. Patient taking differently: Take 1,000 mg by mouth 2 (two) times daily. Dr Maretta Bees 03/14/15  Yes Juline Patch, MD  Omega 3 1000 MG CAPS Take 1 capsule by mouth daily.   Yes [provider]  promethazine-dextromethorphan (PROMETHAZINE-DM) 6.25-15 MG/5ML syrup Take 5 mLs by mouth 4 (four) times daily as needed. 04/01/20  Yes Margarette Canada, NP  Semaglutide,0.25 or 0.5MG/DOS, (OZEMPIC, 0.25 OR 0.5 MG/DOSE,) 2 MG/1.5ML SOPN Inject into the skin. Dr Honor Junes 05/05/19  Yes [provider]  pioglitazone (ACTOS) 15 MG tablet Take 1 tablet by mouth daily. Dr Honor Junes 01/03/17 03/14/20  [provider]    Family History Family History  Problem Relation Age of Onset  . COPD Father     Social History Social History   Tobacco Use  . Smoking status: Former Smoker    Quit date: 04/01/2006    Years since quitting: 14.0  . Smokeless tobacco: Never Used  Vaping Use  . Vaping Use: Never used  Substance Use Topics  . Alcohol use: Yes    Alcohol/week: 0.0 standard drinks    Comment: none recently  . Drug use: No     Allergies   Patient has no known allergies.   Review of Systems Review of Systems  Constitutional: Negative for activity change, appetite change, fatigue and fever.  HENT: Positive for congestion and sore throat. Negative for ear pain, rhinorrhea, sinus pressure and sinus pain.   Respiratory: Positive for cough. Negative for shortness of breath.   Gastrointestinal: Negative for diarrhea, nausea and vomiting.  Musculoskeletal: Negative for arthralgias and myalgias.  Skin: Negative for rash.  Neurological: Positive for headaches.  Hematological: Negative.   Psychiatric/Behavioral: Negative.      Physical Exam Triage Vital Signs ED Triage Vitals  Enc Vitals Group     BP      Pulse      Resp      Temp      Temp src      SpO2      Weight      Height      Head Circumference      Peak Flow      Pain Score      Pain Loc      Pain Edu?      Excl. in [place]?    No data found.  Updated Vital Signs BP 126/80 (BP Location: Right Arm)   Pulse (!) 57   Temp 98.5 F (36.9 C) (Oral)   Resp 18   Ht '5\' 9"'  (1.753 m)   Wt 257 lb 15 oz (117 kg)    SpO2 97%   BMI 38.09 kg/m   Visual Acuity Right Eye Distance:   Left Eye Distance:   Bilateral Distance:    Right Eye Near:   Left Eye Near:    Bilateral Near:     Physical Exam Constitutional:      General: He is not in acute distress.    Appearance: Normal appearance. He is not ill-appearing.  HENT:     Head: Normocephalic and atraumatic.     Right Ear: Tympanic membrane, ear canal  and external ear normal.     Left Ear: Tympanic membrane, ear canal and external ear normal.     Nose: Congestion and rhinorrhea present.     Comments: Nasal mucosa is erythematous with mild edema and a milky yellow-green nasal discharge.  Sinuses are nontender to percussion.    Mouth/Throat:     Mouth: Mucous membranes are moist.     Pharynx: Oropharynx is clear. Posterior oropharyngeal erythema present.     Comments: Patient has mild erythema to the posterior oropharynx with some yellow postnasal drip.  Tonsillar pillars are surgically absent. Cardiovascular:     Rate and Rhythm: Normal rate and regular rhythm.     Pulses: Normal pulses.     Heart sounds: Normal heart sounds. No murmur heard. No gallop.   Pulmonary:     Effort: Pulmonary effort is normal.     Breath sounds: Normal breath sounds. No wheezing, rhonchi or rales.  Musculoskeletal:     Cervical back: Normal range of motion and neck supple.  Lymphadenopathy:     Cervical: No cervical adenopathy.  Skin:    General: Skin is warm and dry.     Capillary Refill: Capillary refill takes less than 2 seconds.     Findings: No erythema or rash.  Neurological:     General: No focal deficit present.     Mental Status: He is alert and oriented to person, place, and time.  Psychiatric:        Mood and Affect: Mood normal.        Behavior: Behavior normal.        Thought Content: Thought content normal.        Judgment: Judgment normal.      UC Treatments / Results  Labs (all labs ordered are listed, but only abnormal results  are displayed) Labs Reviewed - No data to display  EKG   Radiology No results found.  Procedures Procedures (including critical care time)  Medications Ordered in UC Medications - No data to display  Initial Impression / Assessment and Plan / UC Course  I have reviewed the triage vital signs and the nursing notes.  Pertinent labs & imaging results that were available during my care of the patient were reviewed by me and considered in my medical decision making (see chart for details).   Patient is here for evaluation of nasal congestion and cough that is been going on for 1 day.  Patient is nontoxic in appearance.  Nasal mucosa is mildly erythematous and edematous with a purulent discharge.  Sinuses nontender to percussion.  There is the same discharge going on the posterior oropharynx.  Lungs clear to auscultation.  We will treat patient with Tessalon Perles and Promethazine DM cough syrup, sinus irrigation, and Z-Pak.   Final Clinical Impressions(s) / UC Diagnoses   Final diagnoses:  Upper respiratory tract infection, unspecified type     Discharge Instructions     Take the Z-Pak as instructed on the package insert.  There will be 2 tablets on day 1 and 1 tablet days 2 through 5.  Take the [facility] during the day as needed for cough.  Take them with a small sip of water.  They may give you some numbness to the base of your tongue or metallic taste in your mouth, this is normal.  Use the Promethazine DM cough syrup at night for cough, congestion, and sleep as it make you drowsy.  Perform sinus irrigation 2-3 times a day with  a [employer] sinus rinse kit and distilled water.  Do not use tap water.  If your symptoms persist, or new symptoms develop, follow-up with your primary care provider.    ED Prescriptions    Medication Sig Dispense Auth. Provider   azithromycin (ZITHROMAX Z-PAK) 250 MG tablet Take 1 tablet (250 mg total) by mouth daily. Take 2 tablets on day 1  and 1 tablet days 2 through 5. 6 tablet Margarette Canada, NP   benzonatate (TESSALON) 100 MG capsule Take 2 capsules (200 mg total) by mouth every 8 (eight) hours. 21 capsule Margarette Canada, NP   promethazine-dextromethorphan (PROMETHAZINE-DM) 6.25-15 MG/5ML syrup Take 5 mLs by mouth 4 (four) times daily as needed. 118 mL Margarette Canada, NP     PDMP not reviewed this encounter.   Margarette Canada, NP 04/01/20 [phone]

## 2020-04-26 DIAGNOSIS — I4891 Unspecified atrial fibrillation: Secondary | ICD-10-CM | POA: Diagnosis not present

## 2020-04-26 DIAGNOSIS — R0602 Shortness of breath: Secondary | ICD-10-CM | POA: Diagnosis not present

## 2020-05-01 DIAGNOSIS — E1159 Type 2 diabetes mellitus with other circulatory complications: Secondary | ICD-10-CM | POA: Diagnosis not present

## 2020-05-01 DIAGNOSIS — I480 Paroxysmal atrial fibrillation: Secondary | ICD-10-CM | POA: Diagnosis not present

## 2020-05-01 DIAGNOSIS — E7849 Other hyperlipidemia: Secondary | ICD-10-CM | POA: Diagnosis not present

## 2020-05-01 DIAGNOSIS — I152 Hypertension secondary to endocrine disorders: Secondary | ICD-10-CM | POA: Diagnosis not present

## 2020-12-25 DIAGNOSIS — I152 Hypertension secondary to endocrine disorders: Secondary | ICD-10-CM | POA: Diagnosis not present

## 2020-12-25 DIAGNOSIS — E1169 Type 2 diabetes mellitus with other specified complication: Secondary | ICD-10-CM | POA: Diagnosis not present

## 2020-12-25 DIAGNOSIS — E1159 Type 2 diabetes mellitus with other circulatory complications: Secondary | ICD-10-CM | POA: Diagnosis not present

## 2020-12-25 DIAGNOSIS — E785 Hyperlipidemia, unspecified: Secondary | ICD-10-CM | POA: Diagnosis not present

## 2020-12-25 LAB — BASIC METABOLIC PANEL
BUN: 16 (ref 4–21)
Chloride: 103 (ref 99–108)
Creatinine: 0.9 (ref 0.6–1.3)
Glucose: 151
Potassium: 4.1 (ref 3.4–5.3)
Sodium: 140 (ref 137–147)

## 2020-12-25 LAB — HEMOGLOBIN A1C: Hemoglobin A1C: 7.2

## 2020-12-25 LAB — LIPID PANEL
Cholesterol: 145 (ref 0–200)
HDL: 44 (ref 35–70)
LDL Cholesterol: 72
Triglycerides: 172 — AB (ref 40–160)

## 2020-12-25 LAB — COMPREHENSIVE METABOLIC PANEL: Calcium: 9.6 (ref 8.7–10.7)

## 2020-12-25 LAB — TSH: TSH: 1.67 (ref 0.41–5.90)

## 2020-12-25 LAB — HM DIABETES FOOT EXAM: HM Diabetic Foot Exam: NORMAL

## 2021-01-11 DIAGNOSIS — E1169 Type 2 diabetes mellitus with other specified complication: Secondary | ICD-10-CM | POA: Diagnosis not present

## 2021-01-11 DIAGNOSIS — E7849 Other hyperlipidemia: Secondary | ICD-10-CM | POA: Diagnosis not present

## 2021-01-11 DIAGNOSIS — E1159 Type 2 diabetes mellitus with other circulatory complications: Secondary | ICD-10-CM | POA: Diagnosis not present

## 2021-01-11 DIAGNOSIS — I480 Paroxysmal atrial fibrillation: Secondary | ICD-10-CM | POA: Diagnosis not present

## 2021-02-04 ENCOUNTER — Ambulatory Visit (INDEPENDENT_AMBULATORY_CARE_PROVIDER_SITE_OTHER): Payer: 59

## 2021-02-04 ENCOUNTER — Ambulatory Visit
Admission: EM | Admit: 2021-02-04 | Discharge: 2021-02-04 | Disposition: A | Discharge status: Home / Self Care | Payer: 59 | Attending: Emergency Medicine | Admitting: Emergency Medicine

## 2021-02-04 ENCOUNTER — Other Ambulatory Visit: Payer: Self-pay

## 2021-02-04 DIAGNOSIS — R220 Localized swelling, mass and lump, head: Secondary | ICD-10-CM | POA: Diagnosis not present

## 2021-02-04 DIAGNOSIS — M542 Cervicalgia: Secondary | ICD-10-CM

## 2021-02-04 DIAGNOSIS — R221 Localized swelling, mass and lump, neck: Secondary | ICD-10-CM | POA: Diagnosis not present

## 2021-02-04 DIAGNOSIS — M5030 Other cervical disc degeneration, unspecified cervical region: Secondary | ICD-10-CM | POA: Diagnosis not present

## 2021-02-04 MED ORDER — SULFAMETHOXAZOLE-TRIMETHOPRIM 800-160 MG PO TABS: 1.0000 | ORAL_TABLET | Freq: Two times a day (BID) | ORAL | 0 refills | Status: AC

## 2021-02-04 MED ORDER — AMOXICILLIN-POT CLAVULANATE 875-125 MG PO TABS: 1.0000 | ORAL_TABLET | Freq: Two times a day (BID) | ORAL | 0 refills | Status: DC

## 2021-02-04 NOTE — ED Triage Notes (Signed)
Pt here with C/O left side facial swelling, is tender with touch, Denies ear pain, sore throat since this morning.

## 2021-02-04 NOTE — Discharge Instructions (Addendum)
Your x-ray today did not demonstrate the presence of a salivary stone or any mass in her neck or along her jawline.  The most likely culprit of your swelling is either a salivary gland infection or a salivary gland stone.  Try increasing your consumption of sour foods and suck on lemon or sour candy to increase her salivary production to attempt to dislodge the stone if that is the cause of your symptoms.  You can also apply warm prophylaxis to the left side of your face to help with inflammation and passage of a salivary stone if it is present.  You may also massage your salivary gland to try to express the stone.  If your swelling continues after 2 days, or you develop an increase in swelling, redness, or fever it is possible that he might be an infection and I would recommend picking up the Augmentin and Bactrim antibiotics at that time.  You will take both medications twice daily for 7-day duration.  Return for reevaluation for new or worsening symptoms.

## 2021-02-04 NOTE — ED Provider Notes (Signed)
[facility]    CSN: [id number] Arrival date & time: 02/04/21  1020      History   Chief Complaint Chief Complaint  Patient presents with   Facial Swelling    HPI Darius Brown. is a 65 y.o. male.   HPI  65 year old male here for evaluation of left-sided facial swelling.  Patient reports he developed left-sided swelling that is tender to the touch this morning.  He states that the pain radiates up to his left ear but he denies any changes of hearing in his left ear.  He denies any fever, abdominal pain, pain with movement of his jaw or movement of his head.  He has not had anything like this in the past.  Past Medical History:  Diagnosis Date   Diabetes mellitus without complication ([place])    Hyperlipidemia    Hypertension     Patient Active Problem List   Diagnosis Date Noted   Taking medication for chronic disease 01/08/2017   Special screening for malignant neoplasms, colon    Benign neoplasm of ascending colon    Benign neoplasm of descending colon    Familial multiple lipoprotein-type hyperlipidemia 08/23/2014   Cerebrovascular accident, impending ([place]) 08/23/2014   Temporomandibular joint disorders 08/23/2014   Arthropathy of ankle and foot 08/23/2014   Aftercare following right knee joint replacement surgery 08/23/2014   Diabetes mellitus type 2, uncontrolled 08/23/2014   Essential (primary) hypertension 08/23/2014   Adult BMI 30+ 08/23/2014   Status post left knee replacement 07/01/2014   Abnormal stress test 05/25/2014   HTN (hypertension) 05/25/2014   Hyperlipemia 05/25/2014   Obesity 05/25/2014   Type 2 diabetes mellitus without complication ([place]) 54/00/8676   Primary osteoarthritis of left knee 05/19/2014   Primary localized osteoarthrosis, lower leg 06/30/2013    Past Surgical History:  Procedure Laterality Date   APPENDECTOMY     COLONOSCOPY  2007   repeat in 10 years   COLONOSCOPY WITH PROPOFOL N/A 06/27/2016   Procedure:  COLONOSCOPY WITH PROPOFOL;  Surgeon: Lucilla Lame, MD;  Location: [place];  Service: Endoscopy;  Laterality: N/A;  Diabetic - insulin and oral meds   HERNIA REPAIR     x 2   KNEE SURGERY Right    KNEE SURGERY Left    POLYPECTOMY  06/27/2016   Procedure: POLYPECTOMY;  Surgeon: Lucilla Lame, MD;  Location: [facility];  Service: Endoscopy;;   TONSILLECTOMY         Home Medications    Prior to Admission medications   Medication Sig Start Date End Date Taking? Authorizing Provider  amoxicillin-clavulanate (AUGMENTIN) 875-125 MG tablet Take 1 tablet by mouth every 12 (twelve) hours. 02/04/21  Yes Margarette Canada, NP  atorvastatin (LIPITOR) 20 MG tablet TAKE 1 TABLET BY MOUTH EVERY DAY 11/03/17  Yes Juline Patch, MD  empagliflozin (JARDIANCE) 25 MG TABS tablet Take 25 mg by mouth daily. Dr Maretta Bees 03/08/16  Yes [provider]  gabapentin (NEURONTIN) 300 MG capsule Take 300 mg by mouth at bedtime. O'connell   Yes [provider]  lisinopril (ZESTRIL) 10 MG tablet Take 1 tablet by mouth daily. 11/12/19  Yes [provider]  metFORMIN (GLUCOPHAGE) 1000 MG tablet Take 1 tablet (1,000 mg total) by mouth 2 (two) times daily. Patient taking differently: Take 1,000 mg by mouth 2 (two) times daily. Dr Maretta Bees 03/14/15  Yes Juline Patch, MD  Omega 3 1000 MG CAPS Take 1 capsule by mouth daily.   Yes [provider]  pioglitazone (ACTOS) 15 MG tablet Take 1 tablet by mouth daily. Dr Honor Junes 01/03/17 02/04/21 Yes [provider]  Semaglutide,0.25 or 0.5MG /DOS, (OZEMPIC, 0.25 OR 0.5 MG/DOSE,) 2 MG/1.5ML SOPN Inject into the skin. Dr Honor Junes 05/05/19  Yes [provider]  sulfamethoxazole-trimethoprim (BACTRIM DS) 800-160 MG tablet Take 1 tablet by mouth 2 (two) times daily for 7 days. 02/04/21 02/11/21 Yes Margarette Canada, NP    Family History Family History  Problem Relation Age of Onset   COPD Father     Social History Social History    Tobacco Use   Smoking status: Former    Types: Cigarettes    Quit date: 04/01/2006    Years since quitting: 14.8   Smokeless tobacco: Never  Vaping Use   Vaping Use: Never used  Substance Use Topics   Alcohol use: Yes    Alcohol/week: 0.0 standard drinks    Comment: none recently   Drug use: No     Allergies   Patient has no known allergies.   Review of Systems Review of Systems  Constitutional:  Negative for activity change, appetite change and fever.  HENT:  Positive for ear pain and facial swelling. Negative for ear discharge, hearing loss, sore throat, trouble swallowing and voice change.   Hematological: Negative.   Psychiatric/Behavioral: Negative.      Physical Exam Triage Vital Signs ED Triage Vitals  Enc Vitals Group     BP 02/04/21 1107 140/90     Pulse Rate 02/04/21 1107 91     Resp 02/04/21 1107 18     Temp 02/04/21 1107 98.2 F (36.8 C)     Temp Source 02/04/21 1107 Oral     SpO2 02/04/21 1107 98 %     Weight 02/04/21 1105 255 lb (115.7 kg)     Height 02/04/21 1105 5\' 9"  (1.753 m)     Head Circumference --      Peak Flow --      Pain Score 02/04/21 1105 0     Pain Loc --      Pain Edu? --      Excl. in [place]? --    No data found.  Updated Vital Signs BP 140/90 (BP Location: Left Arm)   Pulse 91   Temp 98.2 F (36.8 C) (Oral)   Resp 18   Ht 5\' 9"  (1.753 m)   Wt 255 lb (115.7 kg)   SpO2 98%   BMI 37.66 kg/m   Visual Acuity Right Eye Distance:   Left Eye Distance:   Bilateral Distance:    Right Eye Near:   Left Eye Near:    Bilateral Near:     Physical Exam Vitals and nursing note reviewed.  Constitutional:      General: He is not in acute distress.    Appearance: Normal appearance. He is not ill-appearing.  HENT:     Head: Normocephalic and atraumatic.     Right Ear: Tympanic membrane, ear canal and external ear normal. There is no impacted cerumen.     Left Ear: Tympanic membrane, ear canal and external ear normal. There is no  impacted cerumen.     Nose: Nose normal. No congestion or rhinorrhea.     Mouth/Throat:     Mouth: Mucous membranes are moist.     Pharynx: Oropharynx is clear. No posterior oropharyngeal erythema.  Cardiovascular:     Rate and Rhythm: Normal rate and regular rhythm.     Pulses: Normal pulses.  Heart sounds: Normal heart sounds. No murmur heard.   No gallop.  Pulmonary:     Effort: Pulmonary effort is normal.     Breath sounds: Normal breath sounds. No wheezing, rhonchi or rales.  Musculoskeletal:     Cervical back: Neck supple. No tenderness.  Lymphadenopathy:     Cervical: No cervical adenopathy.  Skin:    General: Skin is warm and dry.     Capillary Refill: Capillary refill takes less than 2 seconds.     Findings: No erythema or rash.  Neurological:     General: No focal deficit present.     Mental Status: He is alert and oriented to person, place, and time.  Psychiatric:        Mood and Affect: Mood normal.        Behavior: Behavior normal.        Thought Content: Thought content normal.        Judgment: Judgment normal.     UC Treatments / Results  Labs (all labs ordered are listed, but only abnormal results are displayed) Labs Reviewed - No data to display  EKG   Radiology DG Neck Soft Tissue  Result Date: 02/04/2021 CLINICAL DATA:  Left facial and neck swelling EXAM: NECK SOFT TISSUES - 1+ VIEW COMPARISON:  None. FINDINGS: There is no evidence of retropharyngeal soft tissue swelling or epiglottic enlargement. The cervical airway is unremarkable and no radio-opaque foreign body identified. Mild-to-moderate degenerative disc disease throughout the cervical spine. No appreciable soft tissue emphysema. IMPRESSION: No appreciable epiglottic or retropharyngeal soft tissue swelling. No radiopaque foreign bodies. Mild-to-moderate degenerative disc disease throughout the cervical spine. Electronically Signed   By: Ilona Sorrel M.D.   On: 02/04/2021 12:35     Procedures Procedures (including critical care time)  Medications Ordered in UC Medications - No data to display  Initial Impression / Assessment and Plan / UC Course  I have reviewed the triage vital signs and the nursing notes.  Pertinent labs & imaging results that were available during my care of the patient were reviewed by me and considered in my medical decision making (see chart for details).  Patient is a nontoxic-appearing 65 year old male here for evaluation of left-sided facial swelling that started this morning abruptly.  He reports he was fine when he went to bed last night.  The area is tender to touch and when he reports the pain radiates to his left ear.  He does not have any pain when he opens and closes his jaw he denies any pain in his teeth.  He has not had a fever.  He has never had anything like this in the past.  Patient's physical exam reveals marked edema to the left side of the face along the angle of the jaw with swelling extending down into the left side of the neck.  The area is mildly tender to touch but there is no overlying erythema.  Bilateral tympanic membranes are pearly gray with normal light reflex and clear external auditory canals.  No cervical lymphadenopathy appreciated on exam.  Suspect patient has a parotid stone or parotid infection.  Will obtain soft tissue neck to look for the presence of stone.  X-ray soft tissue neck independently reviewed and evaluated by me.  Impression: There is soft tissue swelling to the left side of the patient's jaw and neck without any identifiable mass.  No readily identifiable salivary stone.  Radiology overread is pending. Radiology impression is no appreciable epiglottic or retropharyngeal  soft tissue swelling.  No radiopaque foreign bodies.  Mild to moderate degenerative disc disease throughout the cervical spine.     Final Clinical Impressions(s) / UC Diagnoses   Final diagnoses:  Facial swelling      Discharge Instructions      Your x-ray today did not demonstrate the presence of a salivary stone or any mass in her neck or along her jawline.  The most likely culprit of your swelling is either a salivary gland infection or a salivary gland stone.  Try increasing your consumption of sour foods and suck on lemon or sour candy to increase her salivary production to attempt to dislodge the stone if that is the cause of your symptoms.  You can also apply warm prophylaxis to the left side of your face to help with inflammation and passage of a salivary stone if it is present.  You may also massage your salivary gland to try to express the stone.  If your swelling continues after 2 days, or you develop an increase in swelling, redness, or fever it is possible that he might be an infection and I would recommend picking up the Augmentin and Bactrim antibiotics at that time.  You will take both medications twice daily for 7-day duration.  Return for reevaluation for new or worsening symptoms.     ED Prescriptions     Medication Sig Dispense Auth. Provider   amoxicillin-clavulanate (AUGMENTIN) 875-125 MG tablet Take 1 tablet by mouth every 12 (twelve) hours. 14 tablet Margarette Canada, NP   sulfamethoxazole-trimethoprim (BACTRIM DS) 800-160 MG tablet Take 1 tablet by mouth 2 (two) times daily for 7 days. 14 tablet Margarette Canada, NP      PDMP not reviewed this encounter.   Margarette Canada, NP 02/04/21 1254

## 2021-03-12 ENCOUNTER — Encounter: Payer: Self-pay | Admitting: Family Medicine

## 2021-03-12 ENCOUNTER — Telehealth: Payer: Self-pay

## 2021-03-12 ENCOUNTER — Ambulatory Visit
Admission: EM | Admit: 2021-03-12 | Discharge: 2021-03-12 | Disposition: A | Discharge status: Home / Self Care | Payer: 59 | Attending: Emergency Medicine | Admitting: Emergency Medicine

## 2021-03-12 ENCOUNTER — Ambulatory Visit (INDEPENDENT_AMBULATORY_CARE_PROVIDER_SITE_OTHER): Payer: Commercial Indemnity | Admitting: Family Medicine

## 2021-03-12 ENCOUNTER — Other Ambulatory Visit: Payer: Self-pay

## 2021-03-12 ENCOUNTER — Ambulatory Visit (INDEPENDENT_AMBULATORY_CARE_PROVIDER_SITE_OTHER): Payer: 59

## 2021-03-12 DIAGNOSIS — W19XXXA Unspecified fall, initial encounter: Secondary | ICD-10-CM | POA: Diagnosis not present

## 2021-03-12 DIAGNOSIS — S2242XA Multiple fractures of ribs, left side, initial encounter for closed fracture: Secondary | ICD-10-CM

## 2021-03-12 DIAGNOSIS — R52 Pain, unspecified: Secondary | ICD-10-CM

## 2021-03-12 DIAGNOSIS — R079 Chest pain, unspecified: Secondary | ICD-10-CM

## 2021-03-12 DIAGNOSIS — R0789 Other chest pain: Secondary | ICD-10-CM | POA: Diagnosis not present

## 2021-03-12 DIAGNOSIS — R0781 Pleurodynia: Secondary | ICD-10-CM | POA: Diagnosis not present

## 2021-03-12 NOTE — ED Triage Notes (Signed)
Patient presents to Urgent Care with complaints of a fall since last Sunday. He states from fall he landed on his right forearm, left rist. He states his chest is tender from fall. Pt states pain has not resolved.

## 2021-03-12 NOTE — Progress Notes (Signed)
Date:  03/12/2021   Name:  Darius Brown.   DOB:  07/16/1955   MRN:  [id number]   Chief Complaint: Fall (Last Sunday fell at restaurant on L) arm. Arm still hurting )  Fall   Lab Results  Component Value Date   NA 140 12/25/2020   K 4.1 12/25/2020   CO2 20 03/21/2016   GLUCOSE 126 (H) 03/21/2016   BUN 16 12/25/2020   CREATININE 0.9 12/25/2020   CALCIUM 9.6 12/25/2020   GFRNONAA 90 03/21/2016   Lab Results  Component Value Date   CHOL 145 12/25/2020   HDL 44 12/25/2020   LDLCALC 72 12/25/2020   TRIG 172 (A) 12/25/2020   CHOLHDL 3.5 03/21/2016   Lab Results  Component Value Date   TSH 1.67 12/25/2020   Lab Results  Component Value Date   HGBA1C 7.2 12/25/2020   Lab Results  Component Value Date   WBC 7.0 10/19/2011   HGB 13.7 10/19/2011   HCT 41.0 10/19/2011   MCV 96 10/19/2011   PLT 235 10/19/2011   Lab Results  Component Value Date   ALT 28 04/21/2014   AST 22 04/21/2014   ALKPHOS 46 (L) 10/19/2011   BILITOT 1.1 (H) 10/19/2011   No results found for: Davy Pique, VD25OH   Review of Systems  Patient Active Problem List   Diagnosis Date Noted   Taking medication for chronic disease 01/08/2017   Special screening for malignant neoplasms, colon    Benign neoplasm of ascending colon    Benign neoplasm of descending colon    Familial multiple lipoprotein-type hyperlipidemia 08/23/2014   Cerebrovascular accident, impending ([place]) 08/23/2014   Temporomandibular joint disorders 08/23/2014   Arthropathy of ankle and foot 08/23/2014   Aftercare following right knee joint replacement surgery 08/23/2014   Diabetes mellitus type 2, uncontrolled 08/23/2014   Essential (primary) hypertension 08/23/2014   Adult BMI 30+ 08/23/2014   Status post left knee replacement 07/01/2014   Abnormal stress test 05/25/2014   HTN (hypertension) 05/25/2014   Hyperlipemia 05/25/2014   Obesity 05/25/2014   Type 2 diabetes mellitus without complication ([place])  67/67/2094   Primary osteoarthritis of left knee 05/19/2014   Primary localized osteoarthrosis, lower leg 06/30/2013    No Known Allergies  Past Surgical History:  Procedure Laterality Date   APPENDECTOMY     COLONOSCOPY  2007   repeat in 10 years   COLONOSCOPY WITH PROPOFOL N/A 06/27/2016   Procedure: COLONOSCOPY WITH PROPOFOL;  Surgeon: Lucilla Lame, MD;  Location: [facility];  Service: Endoscopy;  Laterality: N/A;  Diabetic - insulin and oral meds   HERNIA REPAIR     x 2   KNEE SURGERY Right    KNEE SURGERY Left    POLYPECTOMY  06/27/2016   Procedure: POLYPECTOMY;  Surgeon: Lucilla Lame, MD;  Location: [facility];  Service: Endoscopy;;   TONSILLECTOMY      Social History   Tobacco Use   Smoking status: Former    Types: Cigarettes    Quit date: 04/01/2006    Years since quitting: 14.9   Smokeless tobacco: Never  Vaping Use   Vaping Use: Never used  Substance Use Topics   Alcohol use: Yes    Alcohol/week: 0.0 standard drinks    Comment: none recently   Drug use: No     Medication list has been reviewed and updated.  Current Meds  Medication Sig   atorvastatin (LIPITOR) 20 MG tablet TAKE 1 TABLET BY MOUTH  EVERY DAY   empagliflozin (JARDIANCE) 25 MG TABS tablet Take 25 mg by mouth daily. Dr Maretta Bees   gabapentin (NEURONTIN) 300 MG capsule Take 300 mg by mouth at bedtime. O'connell   lisinopril (ZESTRIL) 10 MG tablet Take 1 tablet by mouth daily.   metFORMIN (GLUCOPHAGE) 1000 MG tablet Take 1 tablet (1,000 mg total) by mouth 2 (two) times daily. (Patient taking differently: Take 1,000 mg by mouth 2 (two) times daily. Dr Maretta Bees)   Omega 3 1000 MG CAPS Take 1 capsule by mouth daily.   pioglitazone (ACTOS) 15 MG tablet Take 1 tablet by mouth daily. Dr Honor Junes   Semaglutide,0.25 or 0.5MG /DOS, (OZEMPIC, 0.25 OR 0.5 MG/DOSE,) 2 MG/1.5ML SOPN Inject into the skin. Dr Honor Junes    [facility] 2/9 Scores 03/12/2021 11/22/2019 01/08/2017 03/14/2015  PHQ - 2 Score 0 0 0 0   PHQ- 9 Score 0 0 0 -    GAD 7 : Generalized Anxiety Score 03/12/2021 11/22/2019  Nervous, Anxious, on Edge 0 0  Control/stop worrying 0 0  Worry too much - different things 0 0  Trouble relaxing 0 0  Restless 0 0  Easily annoyed or irritable 0 0  Afraid - awful might happen 0 0  Total GAD 7 Score 0 0    BP Readings from Last 3 Encounters:  03/12/21 130/80  02/04/21 140/90  04/01/20 126/80    Physical Exam  Wt Readings from Last 3 Encounters:  03/12/21 257 lb (116.6 kg)  02/04/21 255 lb (115.7 kg)  04/01/20 257 lb 15 oz (117 kg)    BP 130/80   Pulse 80   Ht 5\' 9"  (1.753 m)   Wt 257 lb (116.6 kg)   BMI 37.95 kg/m   Assessment and Plan:  Pt being sent downstairs to urgent care for further evaluation due to needing multiple xrays and falling in an establishment parking lot

## 2021-03-12 NOTE — Discharge Instructions (Addendum)
Your chest x-ray today shows the possibility of nondisplaced fractures of your sixth and seventh rib on the left-hand side.  This is lower than where you were experiencing pain when I was pressing on her chest wall but could explain some of the pain that you are still experiencing.  Use over-the-counter Tylenol and ibuprofen according to the package instructions as needed for pain relief.  You can apply moist heat to your chest wall for 20 minutes at a time 2-3 times a day to also aid in pain relief.  There is an additional finding of a large stone within your left kidney that measures 10 mm in length.  I would follow-up with your primary care provider to discuss a referral to urology for evaluation and monitoring of this.  The chest x-ray did not fully rule out the presence of a pleural mass in your left chest and the radiologist is recommending chest CT for full characterization of this lesion.  Please follow-up with your primary care provider so that they may order a CT scan of your chest for evaluation as we are unable to do so here in the urgent care.  Return for reevaluation for any new or worsening symptoms.

## 2021-03-12 NOTE — ED Provider Notes (Signed)
[facility]    CSN: [id number] Arrival date & time: 03/12/21  1647      History   Chief Complaint Chief Complaint  Patient presents with   Fall    HPI Darius Brown. is a 65 y.o. male.   HPI  65 year old male here for evaluation of left-sided chest wall pain.  Patient reports that approximately a week ago he tripped in the parking lot of a restaurant landed on his right forearm and left wrist.  After the fall he was also experiencing pain on the left side of his chest wall that was worse when he would take in a deep breath, lay on either side while in bed, or when trying to pull himself up into his truck with his left arm.  He has not had any shortness of breath and he denies cough.  He denies any bruising or redness to his chest wall.  He initially went to his PCP for evaluation and they sent him to the urgent care.  Past Medical History:  Diagnosis Date   Diabetes mellitus without complication ([place])    Hyperlipidemia    Hypertension     Patient Active Problem List   Diagnosis Date Noted   Taking medication for chronic disease 01/08/2017   Special screening for malignant neoplasms, colon    Benign neoplasm of ascending colon    Benign neoplasm of descending colon    Familial multiple lipoprotein-type hyperlipidemia 08/23/2014   Cerebrovascular accident, impending ([place]) 08/23/2014   Temporomandibular joint disorders 08/23/2014   Arthropathy of ankle and foot 08/23/2014   Aftercare following right knee joint replacement surgery 08/23/2014   Diabetes mellitus type 2, uncontrolled 08/23/2014   Essential (primary) hypertension 08/23/2014   Adult BMI 30+ 08/23/2014   Status post left knee replacement 07/01/2014   Abnormal stress test 05/25/2014   HTN (hypertension) 05/25/2014   Hyperlipemia 05/25/2014   Obesity 05/25/2014   Type 2 diabetes mellitus without complication ([place]) 55/97/4163   Primary osteoarthritis of left knee 05/19/2014   Primary  localized osteoarthrosis, lower leg 06/30/2013    Past Surgical History:  Procedure Laterality Date   APPENDECTOMY     COLONOSCOPY  2007   repeat in 10 years   COLONOSCOPY WITH PROPOFOL N/A 06/27/2016   Procedure: COLONOSCOPY WITH PROPOFOL;  Surgeon: Lucilla Lame, MD;  Location: [place];  Service: Endoscopy;  Laterality: N/A;  Diabetic - insulin and oral meds   HERNIA REPAIR     x 2   KNEE SURGERY Right    KNEE SURGERY Left    POLYPECTOMY  06/27/2016   Procedure: POLYPECTOMY;  Surgeon: Lucilla Lame, MD;  Location: [place];  Service: Endoscopy;;   TONSILLECTOMY         Home Medications    Prior to Admission medications   Medication Sig Start Date End Date Taking? Authorizing Provider  atorvastatin (LIPITOR) 20 MG tablet TAKE 1 TABLET BY MOUTH EVERY DAY 11/03/17   Juline Patch, MD  empagliflozin (JARDIANCE) 25 MG TABS tablet Take 25 mg by mouth daily. Dr Maretta Bees 03/08/16   [provider]  gabapentin (NEURONTIN) 300 MG capsule Take 300 mg by mouth at bedtime. O'connell    [provider]  lisinopril (ZESTRIL) 10 MG tablet Take 1 tablet by mouth daily. 11/12/19   [provider]  metFORMIN (GLUCOPHAGE) 1000 MG tablet Take 1 tablet (1,000 mg total) by mouth 2 (two) times daily. Patient taking differently: Take 1,000 mg by mouth 2 (two)  times daily. Dr Maretta Bees 03/14/15   Juline Patch, MD  Omega 3 1000 MG CAPS Take 1 capsule by mouth daily.    [provider]  pioglitazone (ACTOS) 15 MG tablet Take 1 tablet by mouth daily. Dr Honor Junes 01/03/17 03/12/21  [provider]  Semaglutide,0.25 or 0.5MG /DOS, (OZEMPIC, 0.25 OR 0.5 MG/DOSE,) 2 MG/1.5ML SOPN Inject into the skin. Dr Honor Junes 05/05/19   [provider]    Family History Family History  Problem Relation Age of Onset   COPD Father     Social History Social History   Tobacco Use   Smoking status: Former    Types: Cigarettes    Quit date: 04/01/2006     Years since quitting: 14.9   Smokeless tobacco: Never  Vaping Use   Vaping Use: Never used  Substance Use Topics   Alcohol use: Yes    Alcohol/week: 0.0 standard drinks    Comment: none recently   Drug use: No     Allergies   Patient has no known allergies.   Review of Systems Review of Systems  Constitutional:  Negative for activity change, appetite change and fever.  Respiratory:  Negative for cough and shortness of breath.   Cardiovascular:  Positive for chest pain.  Skin:  Negative for color change and wound.  Hematological: Negative.   Psychiatric/Behavioral: Negative.      Physical Exam Triage Vital Signs ED Triage Vitals  Enc Vitals Group     BP 03/12/21 1702 133/87     Pulse Rate 03/12/21 1702 66     Resp 03/12/21 1702 16     Temp 03/12/21 1702 98.3 F (36.8 C)     Temp Source 03/12/21 1702 Oral     SpO2 03/12/21 1702 95 %     Weight --      Height --      Head Circumference --      Peak Flow --      Pain Score 03/12/21 1657 0     Pain Loc --      Pain Edu? --      Excl. in [place]? --    No data found.  Updated Vital Signs BP 133/87 (BP Location: Left Arm)   Pulse 66   Temp 98.3 F (36.8 C) (Oral)   Resp 16   SpO2 95%   Visual Acuity Right Eye Distance:   Left Eye Distance:   Bilateral Distance:    Right Eye Near:   Left Eye Near:    Bilateral Near:     Physical Exam Vitals and nursing note reviewed.  Constitutional:      General: He is not in acute distress.    Appearance: Normal appearance. He is not ill-appearing.  HENT:     Head: Normocephalic and atraumatic.  Cardiovascular:     Rate and Rhythm: Normal rate and regular rhythm.     Pulses: Normal pulses.     Heart sounds: Normal heart sounds. No murmur heard.   No gallop.  Pulmonary:     Effort: Pulmonary effort is normal.     Breath sounds: Normal breath sounds. No wheezing, rhonchi or rales.  Chest:     Chest wall: Tenderness present.  Skin:    General: Skin is warm and dry.      Capillary Refill: Capillary refill takes less than 2 seconds.     Findings: No bruising or erythema.  Neurological:     General: No focal deficit present.  Mental Status: He is alert and oriented to person, place, and time.  Psychiatric:        Mood and Affect: Mood normal.        Behavior: Behavior normal.        Thought Content: Thought content normal.        Judgment: Judgment normal.     UC Treatments / Results  Labs (all labs ordered are listed, but only abnormal results are displayed) Labs Reviewed - No data to display  EKG   Radiology DG Chest 1 View  Result Date: 03/12/2021 CLINICAL DATA:  A 65 year old male presents for evaluation of rib pain. Found to have density in the LEFT chest on oblique projections on previous imaging. EXAM: CHEST  1 VIEW COMPARISON:  Comparison made with rib series of March 12, 2021. No additional imaging is available. FINDINGS: Single lateral projection of the chest shows added density in the cardiophrenic angle as was suggested on previous imaging. This very likely represents a prominent epicardial fat pad however, given masslike appearance on oblique projections the possibility of a pleural based lesion in the LEFT chest is not entirely excluded. Consider follow-up imaging with CT for further assessment. IMPRESSION: No acute cardiopulmonary disease. Prominent epicardial fat pad versus less likely pleural based lesion in the LEFT chest. Consider follow-up imaging with CT of the chest for further assessment to exclude pleural based mass. Electronically Signed   By: Zetta Bills M.D.   On: 03/12/2021 19:29   DG Ribs Unilateral W/Chest Left  Result Date: 03/12/2021 CLINICAL DATA:  LEFT chest pain for 1 week status post fall in a 65 year old male. EXAM: LEFT RIBS AND CHEST - 3+ VIEW COMPARISON:  None FINDINGS: Trachea midline. Cardiomediastinal contours and hilar structures are normal. No pneumothorax. Pleural thickening along the LEFT chest  is suggested. Subtle LEFT basilar airspace disease. On oblique projection there is an opacity in the anterior LEFT costodiaphragmatic sulcus measuring up to 6 cm. Mild deformity of anterior seventh rib and sixth rib without visible displaced LEFT-sided renal calculus measuring approximately 10 mm. IMPRESSION: Mild deformity of the anterior LEFT seventh and sixth ribs which may represent nondisplaced fractures, or costochondral injury. Also with question of mild pleural thickening versus prominent extrapleural fat. Lobular density projects over the costodiaphragmatic sulcus on the oblique view in the anterior LEFT chest. Potentially very prominent pre pericardial fat. Consider follow-up PA and lateral chest for further evaluation to exclude a mass lesion in this location. LEFT basilar airspace disease in addition to above findings likely associated atelectasis. No visible pneumothorax. Suspect nephrolithiasis on the LEFT with a large calculus in the lower pole approximately 10 mm. Electronically Signed   By: Zetta Bills M.D.   On: 03/12/2021 18:31    Procedures Procedures (including critical care time)  Medications Ordered in UC Medications - No data to display  Initial Impression / Assessment and Plan / UC Course  I have reviewed the triage vital signs and the nursing notes.  Pertinent labs & imaging results that were available during my care of the patient were reviewed by me and considered in my medical decision making (see chart for details).  Patient is a nontoxic-appearing 65 year old male here for evaluation of left-sided chest wall pain that has been present and ongoing for the past week ever since suffering a fall at ground-level onto a [place] surface.  The pain is mostly activated by pulling with the left arm or when he lays on either his left or right side  in bed.  Initially had some pain with deep breathing as well.  On exam patient is in no acute distress and has a normal axial carriage.   Chest excursion is normal throughout the respiratory cycle.  Lung sounds clear auscultation all fields.  There is no erythema, ecchymosis, or abrasion to the chest wall.  When palpating the superior and lateral aspect of the left chest wall near the tail of the pectoralis muscle patient does have some tenderness to palpation.  Suspect patient's pain is secondary to chest wall contusion but will obtain radiograph to evaluate for possible rib injury.  Left-sided rib films and chest x-ray independently reviewed and evaluated by me.  Impression: No evidence of fracture.  Radiology overread is pending. Radiology impression is mild deformity of the anterior left seventh and sixth ribs which may represent nondisplaced fractures or costochondral injury.  Also with questionable mild pleural thickening versus prominent extrapleural fat.  There is also suspected nephrolithiasis on the left with a large calculus in the lower pole approximately 10 mm.  Additionally, there is a lobular density over the costo diaphragmatic sulcus on the oblique view of the anterior left chest.  Radiology is recommending PA and lateral chest for further evaluation to exclude a mass lesion.  Radiology interpretation of chest x-ray there that there is prominent echo cardial fat pad versus less likely pleural-based lesion in the left chest.  Consider follow-up imaging with CT of the chest for further assessment to exclude pleural-based mass.  I will have the patient follow-up with his primary care provider for further evaluation and dynamic imaging of his chest to determine the etiology of this pleural lesion.  He will also follow-up with his primary care provider for referral to urology for further management of his renal stone.  I will have him use over-the-counter Tylenol and ibuprofen as needed for pain relief as well as moist heat.  Final Clinical Impressions(s) / UC Diagnoses   Final diagnoses:  Closed fracture of multiple ribs of  left side, initial encounter     Discharge Instructions      Your chest x-ray today shows the possibility of nondisplaced fractures of your sixth and seventh rib on the left-hand side.  This is lower than where you were experiencing pain when I was pressing on her chest wall but could explain some of the pain that you are still experiencing.  Use over-the-counter Tylenol and ibuprofen according to the package instructions as needed for pain relief.  You can apply moist heat to your chest wall for 20 minutes at a time 2-3 times a day to also aid in pain relief.  There is an additional finding of a large stone within your left kidney that measures 10 mm in length.  I would follow-up with your primary care provider to discuss a referral to urology for evaluation and monitoring of this.  The chest x-ray did not fully rule out the presence of a pleural mass in your left chest and the radiologist is recommending chest CT for full characterization of this lesion.  Please follow-up with your primary care provider so that they may order a CT scan of your chest for evaluation as we are unable to do so here in the urgent care.  Return for reevaluation for any new or worsening symptoms.     ED Prescriptions   None    PDMP not reviewed this encounter.   Margarette Canada, NP 03/12/21 1941

## 2021-03-12 NOTE — Telephone Encounter (Signed)
Pt came in for fall. After checking him in and talking to him, he fell a week ago in the [employer] parking lot. He is being sent to urgent care for evaluation of his wrists, ribs and anything related to the fall. We went over we do not do third party billing. Amy was notified and asked. Pt will go down to urgent care so follow ups, if needed, will all be through them

## 2021-03-13 ENCOUNTER — Other Ambulatory Visit: Payer: Self-pay

## 2021-03-13 ENCOUNTER — Telehealth: Payer: Self-pay | Admitting: Family Medicine

## 2021-03-13 DIAGNOSIS — N200 Calculus of kidney: Secondary | ICD-10-CM

## 2021-03-13 DIAGNOSIS — R911 Solitary pulmonary nodule: Secondary | ICD-10-CM

## 2021-03-13 NOTE — Telephone Encounter (Signed)
Copied from [place] [id number]. Topic: General - Other >> Mar 13, 2021  8:05 AM Alanda Slim E wrote: Reason for CRM: Pt called and asked for Dr. Ronnald Ramp to read the ED notes from yesterday and give him a call after reading the notes / please advise

## 2021-03-13 NOTE — Progress Notes (Signed)
Urology and CT chest ordered

## 2021-03-15 NOTE — Progress Notes (Signed)
03/19/21 2:14 PM   Darius Brown 02/25/1956 [id number]  Referring provider:  Juline Patch, MD [address] [place],   [address] Chief Complaint  Patient presents with   Nephrolithiasis   New Patient (Initial Visit)     HPI: Darius Brown. is a 65 y.o.male for further evaluation of kidney stones.   He was recently seen in the ED on 03/12/2021 due to a fall. He underwent a Xray of ribs w/chest that showed suspicion of nephrolithiasis on the LEFT with a large calculus in the lower pole approximately 10 mm.   He said no personal history of kidney stone and no flank pain.  He does think he drink more water.  KUB today was performed in order, confirms the presence of 2 left-sided stones, greatest 10 mm in the left lower pole.   PMH: Past Medical History:  Diagnosis Date   Diabetes mellitus without complication ([place])    Hyperlipidemia    Hypertension     Surgical History: Past Surgical History:  Procedure Laterality Date   APPENDECTOMY     COLONOSCOPY  2007   repeat in 10 years   COLONOSCOPY WITH PROPOFOL N/A 06/27/2016   Procedure: COLONOSCOPY WITH PROPOFOL;  Surgeon: Lucilla Lame, MD;  Location: [place];  Service: Endoscopy;  Laterality: N/A;  Diabetic - insulin and oral meds   HERNIA REPAIR     x 2   KNEE SURGERY Right    KNEE SURGERY Left    POLYPECTOMY  06/27/2016   Procedure: POLYPECTOMY;  Surgeon: Lucilla Lame, MD;  Location: [place];  Service: Endoscopy;;   TONSILLECTOMY      Home Medications:  Allergies as of 03/16/2021   No Known Allergies      Medication List        Accurate as of March 16, 2021 11:59 PM. If you have any questions, ask your nurse or doctor.          acetaminophen 650 MG CR tablet Commonly known as: TYLENOL Take by mouth.   atorvastatin 20 MG tablet Commonly known as: LIPITOR Take 1 tablet by mouth daily.   Eliquis 5 MG Tabs tablet Generic drug: apixaban Take 5 mg  by mouth 2 (two) times daily.   empagliflozin 25 MG Tabs tablet Commonly known as: JARDIANCE Take 25 mg by mouth daily. Dr Maretta Bees   gabapentin 300 MG capsule Commonly known as: NEURONTIN Take 300 mg by mouth at bedtime. O'connell   lisinopril 10 MG tablet Commonly known as: ZESTRIL Take 1 tablet by mouth daily.   metFORMIN 1000 MG tablet Commonly known as: GLUCOPHAGE Take 1 tablet by mouth 2 (two) times daily with a meal.   metoprolol tartrate 25 MG tablet Commonly known as: LOPRESSOR Take 25 mg by mouth daily.   omega-3 acid ethyl esters 1 g capsule Commonly known as: LOVAZA Take 2 capsules by mouth 2 (two) times daily.   OneTouch Ultra test strip Generic drug: glucose blood   Ozempic (0.25 or 0.5 MG/DOSE) 2 MG/1.5ML Sopn Generic drug: Semaglutide(0.25 or 0.5MG /DOS) Inject into the skin. Dr Honor Junes   pioglitazone 15 MG tablet Commonly known as: ACTOS Take 1 tablet by mouth daily. Dr Honor Junes        Allergies: No Known Allergies  Family History: Family History  Problem Relation Age of Onset   COPD Father     Social History:  reports that he quit smoking about 14 years ago. His smoking use included cigarettes.  He has never used smokeless tobacco. He reports current alcohol use. He reports that he does not use drugs.   Physical Exam: BP 132/81    Pulse 66    Ht 5\' 10"  (1.778 m)    Wt 255 lb (115.7 kg)    BMI 36.59 kg/m   Constitutional:  Alert and oriented, No acute distress. HEENT:  AT, moist mucus membranes.  Trachea midline, no masses. Cardiovascular: No clubbing, cyanosis, or edema. Respiratory: Normal respiratory effort, no increased work of breathing. Skin: No rashes, bruises or suspicious lesions. Neurologic: Grossly intact, no focal deficits, moving all 4 extremities. Psychiatric: Normal mood and affect.  Laboratory Data:  Lab Results  Component Value Date   CREATININE 0.9 12/25/2020    Lab Results  Component Value Date   PSA 0.4  04/21/2014   Lab Results  Component Value Date   HGBA1C 7.2 12/25/2020    Urinalysis Results for orders placed or performed during the hospital encounter of 03/16/21  Urinalysis, Complete w Microscopic  Result Value Ref Range   Color, Urine YELLOW YELLOW   APPearance CLEAR CLEAR   Specific Gravity, Urine 1.010 1.005 - 1.030   pH 5.0 5.0 - 8.0   Glucose, UA >1,000 (A) NEGATIVE mg/dL   Hgb urine dipstick NEGATIVE NEGATIVE   Bilirubin Urine NEGATIVE NEGATIVE   Ketones, ur NEGATIVE NEGATIVE mg/dL   Protein, ur NEGATIVE NEGATIVE mg/dL   Nitrite NEGATIVE NEGATIVE   Leukocytes,Ua NEGATIVE NEGATIVE   Squamous Epithelial / LPF 0-5 0 - 5   WBC, UA 0-5 0 - 5 WBC/hpf   RBC / HPF 0-5 0 - 5 RBC/hpf   Bacteria, UA NONE SEEN NONE SEEN     Pertinent Imaging: CLINICAL DATA:  LEFT chest pain for 1 week status post fall in a 65 year old male.   EXAM: LEFT RIBS AND CHEST - 3+ VIEW   COMPARISON:  None   FINDINGS: Trachea midline.   Cardiomediastinal contours and hilar structures are normal.   No pneumothorax.   Pleural thickening along the LEFT chest is suggested. Subtle LEFT basilar airspace disease. On oblique projection there is an opacity in the anterior LEFT costodiaphragmatic sulcus measuring up to 6 cm. Mild deformity of anterior seventh rib and sixth rib without visible displaced   LEFT-sided renal calculus measuring approximately 10 mm.   IMPRESSION: Mild deformity of the anterior LEFT seventh and sixth ribs which may represent nondisplaced fractures, or costochondral injury. Also with question of mild pleural thickening versus prominent extrapleural fat.   Lobular density projects over the costodiaphragmatic sulcus on the oblique view in the anterior LEFT chest. Potentially very prominent pre pericardial fat. Consider follow-up PA and lateral chest for further evaluation to exclude a mass lesion in this location.   LEFT basilar airspace disease in addition to above  findings likely associated atelectasis.   No visible pneumothorax.   Suspect nephrolithiasis on the LEFT with a large calculus in the lower pole approximately 10 mm.     Electronically Signed   By: Zetta Bills M.D.   On: 03/12/2021 18:31  I reviewed this KUB along with a KUB that I ordered and performed today.  They are both reviewed with the patient himself.  6 mm left midpole stone and 10 mm left lower pole stone.  Assessment & Plan:    Left kidney stone Completely incidental 1 cm left lower pole stone as well as 6 mm left midpole stone, asymptomatic  We discussed various options including observation, ureteroscopy, and shockwave  lithotripsy for management of the stone.  Given that he has nosymptoms with unknown stone chronicity, his preference is to continue to follow the stone for the time being.  We will plan for KUB in 1 year to assess for any interval growth.  He is agreeable with this plan.  He will let us know if he has any signs or symptoms of symptomatic stone disease in the interim.  All questions were answered. -KUB   Return for 1 year with KUB.  Conley Rolls as a Education administrator for Hollice Espy, MD.,have documented all relevant documentation on the behalf of Hollice Espy, MD,as directed by  Hollice Espy, MD while in the presence of Hollice Espy, MD.  I have reviewed the above documentation for accuracy and completeness, and I agree with the above.   Hollice Espy, MD   [facility] [address], [place], [place] [address] [phone]

## 2021-03-16 ENCOUNTER — Ambulatory Visit
Admission: RE | Admit: 2021-03-16 | Discharge: 2021-03-16 | Disposition: A | Discharge status: Home / Self Care | Payer: 59 | Source: Ambulatory Visit | Attending: Urology | Admitting: Urology

## 2021-03-16 ENCOUNTER — Ambulatory Visit
Admission: RE | Admit: 2021-03-16 | Discharge: 2021-03-16 | Disposition: A | Discharge status: Home / Self Care | Payer: 59 | Attending: Urology | Admitting: Urology

## 2021-03-16 ENCOUNTER — Other Ambulatory Visit: Payer: Self-pay | Admitting: *Deleted

## 2021-03-16 ENCOUNTER — Other Ambulatory Visit: Payer: Self-pay

## 2021-03-16 ENCOUNTER — Encounter: Payer: Self-pay | Admitting: Urology

## 2021-03-16 ENCOUNTER — Other Ambulatory Visit
Admission: RE | Admit: 2021-03-16 | Discharge: 2021-03-16 | Disposition: A | Discharge status: Home / Self Care | Payer: 59 | Source: Home / Self Care | Attending: Urology | Admitting: Urology

## 2021-03-16 ENCOUNTER — Ambulatory Visit (INDEPENDENT_AMBULATORY_CARE_PROVIDER_SITE_OTHER): Payer: Commercial Indemnity | Admitting: Urology

## 2021-03-16 VITALS — BP 132/81 | HR 66 | Ht 70.0 in | Wt 255.0 lb

## 2021-03-16 DIAGNOSIS — N200 Calculus of kidney: Secondary | ICD-10-CM

## 2021-03-16 LAB — URINALYSIS, COMPLETE (UACMP) WITH MICROSCOPIC
Bacteria, UA: NONE SEEN
Bilirubin Urine: NEGATIVE
Glucose, UA: >1000 mg/dL — AB
Hgb urine dipstick: NEGATIVE
Ketones, ur: NEGATIVE mg/dL
Leukocytes,Ua: NEGATIVE
Nitrite: NEGATIVE
Protein, ur: NEGATIVE mg/dL
Specific Gravity, Urine: 1.01 (ref 1.005–1.030)
pH: 5 (ref 5.0–8.0)

## 2021-03-20 ENCOUNTER — Other Ambulatory Visit: Payer: Self-pay

## 2021-03-20 DIAGNOSIS — R911 Solitary pulmonary nodule: Secondary | ICD-10-CM

## 2021-03-21 ENCOUNTER — Ambulatory Visit
Admission: RE | Admit: 2021-03-21 | Discharge: 2021-03-21 | Disposition: A | Discharge status: Home / Self Care | Payer: 59 | Source: Ambulatory Visit | Attending: Family Medicine | Admitting: Family Medicine

## 2021-03-21 DIAGNOSIS — I700 Atherosclerosis of aorta: Secondary | ICD-10-CM | POA: Diagnosis not present

## 2021-03-21 DIAGNOSIS — R911 Solitary pulmonary nodule: Secondary | ICD-10-CM | POA: Diagnosis not present

## 2021-03-21 LAB — POCT I-STAT CREATININE: Creatinine, Ser: 1 mg/dL (ref 0.61–1.24)

## 2021-03-21 MED ORDER — IOHEXOL 300 MG/ML  SOLN
75.0000 mL | Freq: Once | INTRAMUSCULAR | Status: AC | PRN
  Administered 2021-03-21: 15:00:00 75 mL via INTRAVENOUS

## 2021-03-22 ENCOUNTER — Telehealth: Payer: Self-pay

## 2021-03-22 DIAGNOSIS — J9811 Atelectasis: Secondary | ICD-10-CM | POA: Diagnosis not present

## 2021-03-22 DIAGNOSIS — E1159 Type 2 diabetes mellitus with other circulatory complications: Secondary | ICD-10-CM | POA: Diagnosis not present

## 2021-03-22 DIAGNOSIS — I480 Paroxysmal atrial fibrillation: Secondary | ICD-10-CM | POA: Diagnosis not present

## 2021-03-22 DIAGNOSIS — Z01818 Encounter for other preprocedural examination: Secondary | ICD-10-CM | POA: Diagnosis not present

## 2021-03-22 DIAGNOSIS — I455 Other specified heart block: Secondary | ICD-10-CM | POA: Diagnosis not present

## 2021-03-22 DIAGNOSIS — Z7901 Long term (current) use of anticoagulants: Secondary | ICD-10-CM | POA: Diagnosis not present

## 2021-03-22 NOTE — Telephone Encounter (Signed)
Spoke to pt let him know that his CT scan was normal per Dr. Ronnald Ramp. Pt verbalized understanding.  KP

## 2021-03-22 NOTE — Telephone Encounter (Signed)
Copied from [place] [id number]. Topic: General - Call Back - No Documentation >> Mar 22, 2021  2:32 PM Erick Blinks wrote: Reason for CRM: Pt called and wants to discuss his CT scan results, please advise. Wants to know as soon as possible  [phone]

## 2021-04-05 ENCOUNTER — Encounter
Admission: RE | Disposition: A | Discharge status: Home / Self Care | Payer: Self-pay | Source: Home / Self Care | Attending: Cardiology

## 2021-04-05 ENCOUNTER — Observation Stay
Admission: RE | Admit: 2021-04-05 | Discharge: 2021-04-06 | Disposition: A | Discharge status: Home / Self Care | Payer: 59 | Attending: Cardiology | Admitting: Cardiology

## 2021-04-05 ENCOUNTER — Other Ambulatory Visit: Payer: Self-pay

## 2021-04-05 ENCOUNTER — Encounter: Payer: Self-pay | Admitting: Cardiology

## 2021-04-05 DIAGNOSIS — I455 Other specified heart block: Secondary | ICD-10-CM | POA: Diagnosis not present

## 2021-04-05 DIAGNOSIS — Z7901 Long term (current) use of anticoagulants: Secondary | ICD-10-CM | POA: Insufficient documentation

## 2021-04-05 DIAGNOSIS — E119 Type 2 diabetes mellitus without complications: Secondary | ICD-10-CM | POA: Diagnosis not present

## 2021-04-05 DIAGNOSIS — I10 Essential (primary) hypertension: Secondary | ICD-10-CM | POA: Insufficient documentation

## 2021-04-05 DIAGNOSIS — I495 Sick sinus syndrome: Secondary | ICD-10-CM | POA: Diagnosis not present

## 2021-04-05 DIAGNOSIS — Z006 Encounter for examination for normal comparison and control in clinical research program: Secondary | ICD-10-CM | POA: Insufficient documentation

## 2021-04-05 DIAGNOSIS — Z23 Encounter for immunization: Secondary | ICD-10-CM | POA: Insufficient documentation

## 2021-04-05 DIAGNOSIS — Z7984 Long term (current) use of oral hypoglycemic drugs: Secondary | ICD-10-CM | POA: Insufficient documentation

## 2021-04-05 DIAGNOSIS — I480 Paroxysmal atrial fibrillation: Secondary | ICD-10-CM | POA: Diagnosis not present

## 2021-04-05 HISTORY — PX: PACEMAKER LEADLESS INSERTION: EP1219

## 2021-04-05 LAB — GLUCOSE, CAPILLARY
Glucose-Capillary: 151 mg/dL — ABNORMAL HIGH (ref 70–99)
Glucose-Capillary: 114 mg/dL — ABNORMAL HIGH (ref 70–99)

## 2021-04-05 SURGERY — PACEMAKER LEADLESS INSERTION
Anesthesia: Moderate Sedation

## 2021-04-05 MED ORDER — FENTANYL CITRATE (PF) 100 MCG/2ML IJ SOLN
INTRAMUSCULAR | Status: DC | PRN
  Administered 2021-04-05: 25 ug via INTRAVENOUS

## 2021-04-05 MED ORDER — ATORVASTATIN CALCIUM 20 MG PO TABS
20.0000 mg | ORAL_TABLET | Freq: Every day | ORAL | Status: DC
  Administered 2021-04-05: 20 mg via ORAL
  Filled 2021-04-05: qty 1

## 2021-04-05 MED ORDER — PNEUMOCOCCAL VAC POLYVALENT 25 MCG/0.5ML IJ INJ
0.5000 mL | INJECTION | INTRAMUSCULAR | Status: AC
  Administered 2021-04-06: 0.5 mL via INTRAMUSCULAR
  Filled 2021-04-05: qty 0.5

## 2021-04-05 MED ORDER — HEPARIN (PORCINE) IN NACL 2000-0.9 UNIT/L-% IV SOLN
INTRAVENOUS | Status: DC | PRN
  Administered 2021-04-05: 1000 mL

## 2021-04-05 MED ORDER — SODIUM CHLORIDE 0.9% FLUSH: 3.0000 mL | INTRAVENOUS | Status: DC | PRN

## 2021-04-05 MED ORDER — IOHEXOL 300 MG/ML  SOLN
INTRAMUSCULAR | Status: DC | PRN
  Administered 2021-04-05: 10 mL

## 2021-04-05 MED ORDER — HEPARIN SODIUM (PORCINE) 1000 UNIT/ML IJ SOLN
INTRAMUSCULAR | Status: DC | PRN
  Administered 2021-04-05: 5000 [IU] via INTRAVENOUS

## 2021-04-05 MED ORDER — SODIUM CHLORIDE 0.9% FLUSH
3.0000 mL | Freq: Two times a day (BID) | INTRAVENOUS | Status: DC
  Administered 2021-04-05 – 2021-04-06 (×2): 3 mL via INTRAVENOUS

## 2021-04-05 MED ORDER — LIDOCAINE HCL (PF) 1 % IJ SOLN
INTRAMUSCULAR | Status: DC | PRN
  Administered 2021-04-05: 10 mL

## 2021-04-05 MED ORDER — METOPROLOL TARTRATE 50 MG PO TABS
25.0000 mg | ORAL_TABLET | Freq: Two times a day (BID) | ORAL | Status: DC
  Administered 2021-04-05 – 2021-04-06 (×3): 25 mg via ORAL
  Filled 2021-04-05 (×3): qty 1

## 2021-04-05 MED ORDER — SODIUM CHLORIDE 0.45 % IV SOLN: INTRAVENOUS | Status: DC

## 2021-04-05 MED ORDER — INFLUENZA VAC A&B SA ADJ QUAD 0.5 ML IM PRSY
0.5000 mL | PREFILLED_SYRINGE | INTRAMUSCULAR | Status: AC
  Administered 2021-04-06: 0.5 mL via INTRAMUSCULAR
  Filled 2021-04-05: qty 0.5

## 2021-04-05 MED ORDER — ONDANSETRON HCL 4 MG/2ML IJ SOLN: 4.0000 mg | Freq: Four times a day (QID) | INTRAMUSCULAR | Status: DC | PRN

## 2021-04-05 MED ORDER — MIDAZOLAM HCL 2 MG/2ML IJ SOLN
INTRAMUSCULAR | Status: AC
  Filled 2021-04-05: qty 2

## 2021-04-05 MED ORDER — HEPARIN SODIUM (PORCINE) 1000 UNIT/ML IJ SOLN
INTRAMUSCULAR | Status: AC
  Filled 2021-04-05: qty 10

## 2021-04-05 MED ORDER — SODIUM CHLORIDE 0.9 % IV SOLN: INTRAVENOUS | Status: DC

## 2021-04-05 MED ORDER — FENTANYL CITRATE (PF) 100 MCG/2ML IJ SOLN
INTRAMUSCULAR | Status: AC
  Filled 2021-04-05: qty 2

## 2021-04-05 MED ORDER — ACETAMINOPHEN 325 MG PO TABS: 650.0000 mg | ORAL_TABLET | ORAL | Status: DC | PRN

## 2021-04-05 MED ORDER — SODIUM CHLORIDE 0.9 % IV SOLN: 250.0000 mL | INTRAVENOUS | Status: DC | PRN

## 2021-04-05 MED ORDER — GABAPENTIN 300 MG PO CAPS
300.0000 mg | ORAL_CAPSULE | Freq: Every day | ORAL | Status: DC
  Administered 2021-04-05: 300 mg via ORAL
  Filled 2021-04-05: qty 1

## 2021-04-05 MED ORDER — EMPAGLIFLOZIN 25 MG PO TABS
25.0000 mg | ORAL_TABLET | Freq: Every day | ORAL | Status: DC
  Administered 2021-04-05 – 2021-04-06 (×2): 25 mg via ORAL
  Filled 2021-04-05 (×2): qty 1

## 2021-04-05 MED ORDER — PIOGLITAZONE HCL 15 MG PO TABS
15.0000 mg | ORAL_TABLET | Freq: Every day | ORAL | Status: DC
  Administered 2021-04-05: 15 mg via ORAL
  Filled 2021-04-05 (×3): qty 1

## 2021-04-05 MED ORDER — LISINOPRIL 10 MG PO TABS
10.0000 mg | ORAL_TABLET | Freq: Every day | ORAL | Status: DC
  Administered 2021-04-05: 10 mg via ORAL
  Filled 2021-04-05 (×2): qty 1

## 2021-04-05 MED ORDER — MIDAZOLAM HCL 2 MG/2ML IJ SOLN
INTRAMUSCULAR | Status: DC | PRN
  Administered 2021-04-05: 1 mg via INTRAVENOUS

## 2021-04-05 MED ORDER — HEPARIN (PORCINE) IN NACL 1000-0.9 UT/500ML-% IV SOLN
INTRAVENOUS | Status: AC
  Filled 2021-04-05: qty 1000

## 2021-04-05 SURGICAL SUPPLY — 15 items
DILATOR VESSEL 38 20CM 12FR (INTRODUCER) ×1 IMPLANT
DILATOR VESSEL 38 20CM 14FR (INTRODUCER) ×1 IMPLANT
DILATOR VESSEL 38 20CM 18FR (INTRODUCER) ×1 IMPLANT
DILATOR VESSEL 38 20CM 8FR (INTRODUCER) ×1 IMPLANT
MICRA AV TRANSCATH PACING SYS (Pacemaker) ×2 IMPLANT
MICRA INTRODUCER SHEATH (SHEATH) ×2
NDL PERC 18GX7CM (NEEDLE) IMPLANT
NEEDLE PERC 18GX7CM (NEEDLE) ×2 IMPLANT
PACK CARDIAC CATH (CUSTOM PROCEDURE TRAY) ×1 IMPLANT
PAD ELECT DEFIB RADIOL ZOLL (MISCELLANEOUS) ×1 IMPLANT
SHEATH AVANTI 7FRX11 (SHEATH) ×1 IMPLANT
SHEATH INTRODUCER MICRA (SHEATH) IMPLANT
SUT SILK 0 FSL (SUTURE) ×1 IMPLANT
SYSTEM PACING TRNSCTH AV MICRA (Pacemaker) IMPLANT
WIRE AMPLATZ SS-J .035X180CM (WIRE) ×1 IMPLANT

## 2021-04-06 DIAGNOSIS — Z7984 Long term (current) use of oral hypoglycemic drugs: Secondary | ICD-10-CM | POA: Diagnosis not present

## 2021-04-06 DIAGNOSIS — Z23 Encounter for immunization: Secondary | ICD-10-CM | POA: Diagnosis not present

## 2021-04-06 DIAGNOSIS — E119 Type 2 diabetes mellitus without complications: Secondary | ICD-10-CM | POA: Diagnosis not present

## 2021-04-06 DIAGNOSIS — Z006 Encounter for examination for normal comparison and control in clinical research program: Secondary | ICD-10-CM | POA: Diagnosis not present

## 2021-04-06 DIAGNOSIS — R001 Bradycardia, unspecified: Secondary | ICD-10-CM | POA: Diagnosis not present

## 2021-04-06 DIAGNOSIS — I10 Essential (primary) hypertension: Secondary | ICD-10-CM | POA: Diagnosis not present

## 2021-04-06 DIAGNOSIS — I495 Sick sinus syndrome: Secondary | ICD-10-CM | POA: Diagnosis not present

## 2021-04-06 DIAGNOSIS — I480 Paroxysmal atrial fibrillation: Secondary | ICD-10-CM | POA: Diagnosis not present

## 2021-04-06 DIAGNOSIS — Z7901 Long term (current) use of anticoagulants: Secondary | ICD-10-CM | POA: Diagnosis not present

## 2021-04-06 LAB — BASIC METABOLIC PANEL
Anion gap: 8 (ref 5–15)
BUN: 23 mg/dL (ref 8–23)
CO2: 23 mmol/L (ref 22–32)
Calcium: 8.8 mg/dL — ABNORMAL LOW (ref 8.9–10.3)
Chloride: 105 mmol/L (ref 98–111)
Creatinine, Ser: 0.98 mg/dL (ref 0.61–1.24)
GFR, Estimated: >60 mL/min (ref >60)
Glucose, Bld: 144 mg/dL — ABNORMAL HIGH (ref 70–99)
Potassium: 4.3 mmol/L (ref 3.5–5.1)
Sodium: 136 mmol/L (ref 135–145)

## 2021-04-06 LAB — GLUCOSE, CAPILLARY: Glucose-Capillary: 116 mg/dL — ABNORMAL HIGH (ref 70–99)

## 2021-04-06 NOTE — Plan of Care (Signed)

## 2021-04-06 NOTE — Progress Notes (Signed)
°  Transition of Care [facility]) Screening Note   Patient Details  Name: Darius Brown. Date of Birth: 26-Apr-1955   Transition of Care [facility]) CM/SW Contact:    Alberteen Sam, LCSW Phone Number: 04/06/2021, 9:00 AM    Transition of Care Department [facility]) has reviewed patient and no TOC needs have been identified at this time. We will continue to monitor patient advancement through interdisciplinary progression rounds. If new patient transition needs arise, please place a TOC consult.  [place], [place]

## 2021-04-06 NOTE — Discharge Summary (Signed)
Physician Discharge Summary  Patient ID: Darius Brown. MRN: [id number] DOB/AGE: 1955/09/15 66 y.o.  Admit date: 04/05/2021 Discharge date: 04/06/2021  Primary Discharge Diagnosis bradycardia Secondary Discharge Diagnosis sick sinus syndrome  Significant Diagnostic Studies: yes  Consults: None  Hospital Course: The patient underwent elective Micra AV leadless pacemaker implantation on 04/05/2021.  The procedure was successful without periprocedural complications.  He was observed overnight on 11 3 on the progressive care unit.  The morning of 04/06/2021, the patient was asymptomatic, ambulating without difficulty, and was discharged home in stable condition.  Patient scheduled to have follow-up with Dr. Nehemiah Massed in 1 week.   Discharge Exam:   General appearance: alert Head: Normocephalic, without obvious abnormality, atraumatic Eyes: conjunctivae/corneas clear. PERRL, EOM's intact. Fundi benign. Ears: normal TM's and external ear canals both ears Nose: Nares normal. Septum midline. Mucosa normal. No drainage or sinus tenderness. Throat: lips, mucosa, and tongue normal; teeth and gums normal Neck: no adenopathy, no carotid bruit, no JVD, supple, symmetrical, trachea midline, and thyroid not enlarged, symmetric, no tenderness/mass/nodules Back: symmetric, no curvature. ROM normal. No CVA tenderness. Resp: clear to auscultation bilaterally Chest wall: no tenderness Cardio: irregularly irregular rhythm GI: soft, non-tender; bowel sounds normal; no masses,  no organomegaly Extremities: extremities normal, atraumatic, no cyanosis or edema Pulses: 2+ and symmetric Skin: Skin color, texture, turgor normal. No rashes or lesions Lymph nodes: Cervical, supraclavicular, and axillary nodes normal. Neurologic: Grossly normal Incision/Wound: Right groin site well-healed without evidence for hematoma Labs:   Lab Results  Component Value Date   WBC 7.0 10/19/2011   HGB 13.7 10/19/2011    HCT 41.0 10/19/2011   MCV 96 10/19/2011   PLT 235 10/19/2011    Recent Labs  Lab 04/06/21 0610  NA 136  K 4.3  CL 105  CO2 23  BUN 23  CREATININE 0.98  CALCIUM 8.8*  GLUCOSE 144*      Radiology:  EKG: Atrial fibrillation at 93 bpm  FOLLOW UP PLANS AND APPOINTMENTS  Allergies as of 04/06/2021   No Known Allergies      Medication List     TAKE these medications    acetaminophen 650 MG CR tablet Commonly known as: TYLENOL Take 650 mg by mouth every 8 (eight) hours as needed for pain.   atorvastatin 20 MG tablet Commonly known as: LIPITOR Take 20 mg by mouth daily.   Eliquis 5 MG Tabs tablet Generic drug: apixaban Take 5 mg by mouth 2 (two) times daily.   empagliflozin 25 MG Tabs tablet Commonly known as: JARDIANCE Take 25 mg by mouth daily. Dr Maretta Bees   gabapentin 300 MG capsule Commonly known as: NEURONTIN Take 300 mg by mouth at bedtime as needed (pain). O'connell   lisinopril 10 MG tablet Commonly known as: ZESTRIL Take 10 mg by mouth daily.   metFORMIN 1000 MG tablet Commonly known as: GLUCOPHAGE Take 1,000 mg by mouth 2 (two) times daily with a meal.   metoprolol tartrate 25 MG tablet Commonly known as: LOPRESSOR Take 12.5 mg by mouth 2 (two) times daily.   omega-3 acid ethyl esters 1 g capsule Commonly known as: LOVAZA Take 1 g by mouth daily.   OneTouch Ultra test strip Generic drug: glucose blood   Ozempic (0.25 or 0.5 MG/DOSE) 2 MG/1.5ML Sopn Generic drug: Semaglutide(0.25 or 0.5MG /DOS) Inject 0.25 mg into the skin every Sunday. Dr Honor Junes   pioglitazone 15 MG tablet Commonly known as: ACTOS Take 15 mg by mouth daily. Dr Honor Junes  Follow-up Information     Corey Skains, MD Follow up in 1 week(s).   Specialty: Cardiology Contact information: [address] [facility] [place] [address] [phone]                 BRING ALL MEDICATIONS WITH YOU TO FOLLOW UP  APPOINTMENTS  Time spent with patient to include physician time: 25 minutes Signed:  Isaias Cowman MD, PhD, [facility] 04/06/2021, 8:39 AM

## 2021-04-06 NOTE — Discharge Instructions (Signed)
Resume Eliquis on 04/07/2021.  Patient may shower on 04/08/2021.  Leave dressing on, if falls off, replace with bandage.

## 2021-04-13 DIAGNOSIS — I455 Other specified heart block: Secondary | ICD-10-CM | POA: Diagnosis not present

## 2021-04-13 DIAGNOSIS — I480 Paroxysmal atrial fibrillation: Secondary | ICD-10-CM | POA: Diagnosis not present

## 2021-04-13 DIAGNOSIS — E119 Type 2 diabetes mellitus without complications: Secondary | ICD-10-CM | POA: Diagnosis not present

## 2021-04-13 DIAGNOSIS — E1169 Type 2 diabetes mellitus with other specified complication: Secondary | ICD-10-CM | POA: Diagnosis not present

## 2021-06-21 DIAGNOSIS — I455 Other specified heart block: Secondary | ICD-10-CM | POA: Diagnosis not present

## 2021-06-21 DIAGNOSIS — I480 Paroxysmal atrial fibrillation: Secondary | ICD-10-CM | POA: Diagnosis not present

## 2021-06-21 DIAGNOSIS — E1169 Type 2 diabetes mellitus with other specified complication: Secondary | ICD-10-CM | POA: Diagnosis not present

## 2021-06-21 DIAGNOSIS — E785 Hyperlipidemia, unspecified: Secondary | ICD-10-CM | POA: Diagnosis not present

## 2021-06-25 DIAGNOSIS — Z79899 Other long term (current) drug therapy: Secondary | ICD-10-CM | POA: Diagnosis not present

## 2021-06-25 DIAGNOSIS — E1159 Type 2 diabetes mellitus with other circulatory complications: Secondary | ICD-10-CM | POA: Diagnosis not present

## 2021-06-25 DIAGNOSIS — I152 Hypertension secondary to endocrine disorders: Secondary | ICD-10-CM | POA: Diagnosis not present

## 2021-06-25 DIAGNOSIS — E1169 Type 2 diabetes mellitus with other specified complication: Secondary | ICD-10-CM | POA: Diagnosis not present

## 2021-07-04 DIAGNOSIS — I480 Paroxysmal atrial fibrillation: Secondary | ICD-10-CM | POA: Diagnosis not present

## 2021-07-09 DIAGNOSIS — E7849 Other hyperlipidemia: Secondary | ICD-10-CM | POA: Diagnosis not present

## 2021-07-09 DIAGNOSIS — E119 Type 2 diabetes mellitus without complications: Secondary | ICD-10-CM | POA: Diagnosis not present

## 2021-07-09 DIAGNOSIS — I455 Other specified heart block: Secondary | ICD-10-CM | POA: Diagnosis not present

## 2021-07-09 DIAGNOSIS — I480 Paroxysmal atrial fibrillation: Secondary | ICD-10-CM | POA: Diagnosis not present

## 2021-12-21 DIAGNOSIS — Z79899 Other long term (current) drug therapy: Secondary | ICD-10-CM | POA: Diagnosis not present

## 2021-12-21 DIAGNOSIS — E1169 Type 2 diabetes mellitus with other specified complication: Secondary | ICD-10-CM | POA: Diagnosis not present

## 2021-12-21 DIAGNOSIS — I152 Hypertension secondary to endocrine disorders: Secondary | ICD-10-CM | POA: Diagnosis not present

## 2021-12-21 DIAGNOSIS — E119 Type 2 diabetes mellitus without complications: Secondary | ICD-10-CM | POA: Diagnosis not present

## 2021-12-21 DIAGNOSIS — E785 Hyperlipidemia, unspecified: Secondary | ICD-10-CM | POA: Diagnosis not present

## 2021-12-21 DIAGNOSIS — E1159 Type 2 diabetes mellitus with other circulatory complications: Secondary | ICD-10-CM | POA: Diagnosis not present

## 2021-12-27 IMAGING — CR DG ABDOMEN 1V
2 series · 2 of 2 positions shown · non-contrast
Comparison: None.

CLINICAL DATA: Kidney stone

EXAM:
ABDOMEN - 1 VIEW

[abdomen kub (1 of 2)]
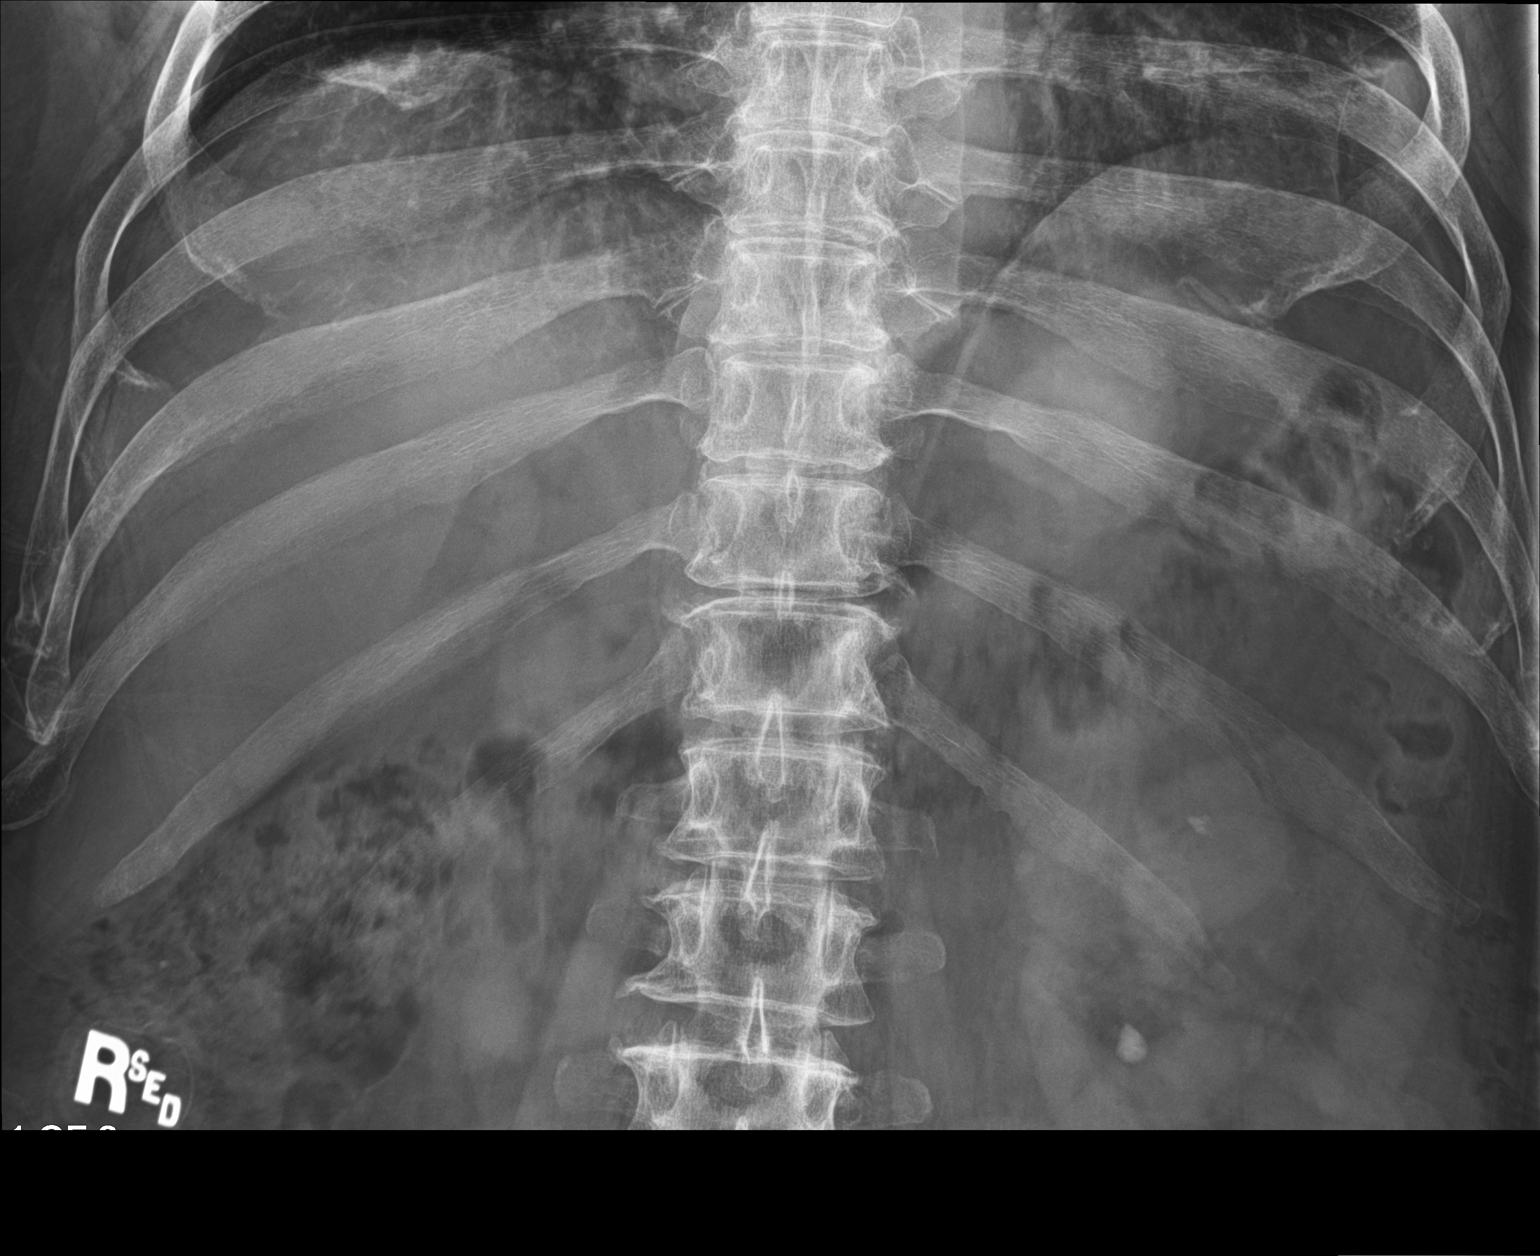

[abdomen kub (2 of 2)]
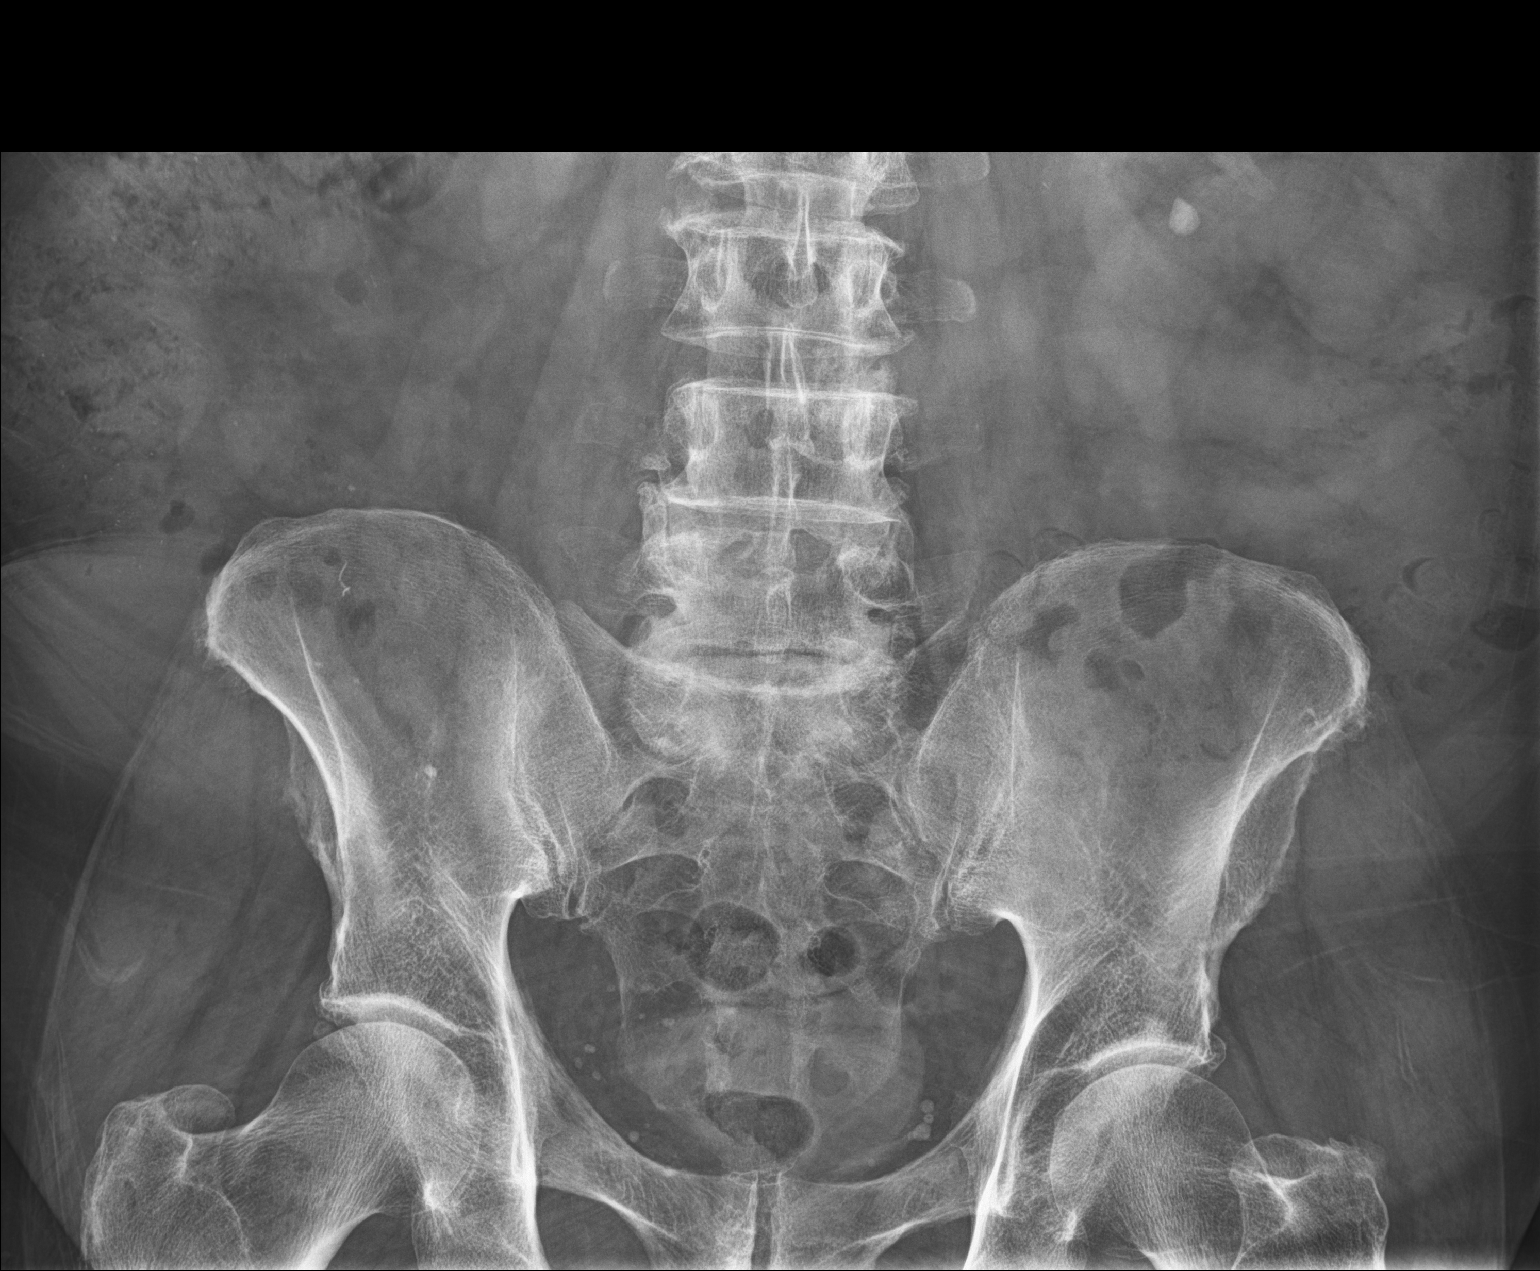

[2 of 2 positions shown; findings below may reference images not displayed]

FINDINGS: 6 mm calcification in the midpole of the left kidney. 10 mm
calcification in the lower pole of the left kidney. No stones over
the right kidney or expected course of the ureters. Calcified
phleboliths in the lower pelvis. Nonobstructive bowel gas pattern.
No organomegaly or free air.
IMPRESSION: Left nephrolithiasis.

## 2021-12-28 DIAGNOSIS — E119 Type 2 diabetes mellitus without complications: Secondary | ICD-10-CM | POA: Diagnosis not present

## 2021-12-28 DIAGNOSIS — E1169 Type 2 diabetes mellitus with other specified complication: Secondary | ICD-10-CM | POA: Diagnosis not present

## 2021-12-28 DIAGNOSIS — E1159 Type 2 diabetes mellitus with other circulatory complications: Secondary | ICD-10-CM | POA: Diagnosis not present

## 2022-01-15 ENCOUNTER — Ambulatory Visit
Admission: EM | Admit: 2022-01-15 | Discharge: 2022-01-15 | Disposition: A | Discharge status: Home / Self Care | Payer: Federal, State, Local not specified - PPO | Attending: Internal Medicine | Admitting: Internal Medicine

## 2022-01-15 DIAGNOSIS — B9789 Other viral agents as the cause of diseases classified elsewhere: Secondary | ICD-10-CM | POA: Diagnosis not present

## 2022-01-15 DIAGNOSIS — R0981 Nasal congestion: Secondary | ICD-10-CM | POA: Insufficient documentation

## 2022-01-15 DIAGNOSIS — J019 Acute sinusitis, unspecified: Secondary | ICD-10-CM | POA: Diagnosis not present

## 2022-01-15 DIAGNOSIS — U071 COVID-19: Secondary | ICD-10-CM | POA: Diagnosis not present

## 2022-01-15 LAB — SARS CORONAVIRUS 2 (TAT 6-24 HRS): SARS Coronavirus 2: POSITIVE — AB

## 2022-01-15 MED ORDER — BENZONATATE 100 MG PO CAPS: 100.0000 mg | ORAL_CAPSULE | Freq: Three times a day (TID) | ORAL | 0 refills | Status: DC | PRN

## 2022-01-15 MED ORDER — FLUTICASONE PROPIONATE 50 MCG/ACT NA SUSP: 1.0000 | Freq: Every day | NASAL | 0 refills | Status: DC

## 2022-01-15 NOTE — ED Triage Notes (Signed)
Patient presents to UC for cough, headache, sinus drainage -- started Sunday.

## 2022-01-15 NOTE — ED Provider Notes (Signed)
[facility]    CSN: [id number] Arrival date & time: 01/15/22  4132      History   Chief Complaint Chief Complaint  Patient presents with   Cough   Headache   Nasal Congestion    HPI Darius Howton. is a 66 y.o. male comes to the urgent care with a 3-day history of nasal congestion, postnasal drainage and a sore throat.  Symptoms started insidiously and has been persistent.  Nasal discharge in the sputum clear.  Patient denies any itchy eyes, no sore throat.  No fever or chills.  No generalized body aches.  Patient is fully vaccinated and boosted against COVID-19 virus.  He denies any history of COVID-19 infection.  No sick contacts.  No exposures to COVID-19 or flu patients.  No nausea, vomiting or diarrhea.  Patient endorses a cough which is worse at night and associated with the postnasal drainage.  No shortness of breath or wheezing.Marland Kitchen   HPI  Past Medical History:  Diagnosis Date   Diabetes mellitus without complication ([place])    Hyperlipidemia    Hypertension     Patient Active Problem List   Diagnosis Date Noted   Sick sinus syndrome ([place]) 04/05/2021   SOBOE (shortness of breath on exertion) 03/16/2020   DDD (degenerative disc disease), lumbosacral 01/14/2018   Taking medication for chronic disease 01/08/2017   Special screening for malignant neoplasms, colon    Benign neoplasm of ascending colon    Benign neoplasm of descending colon    Familial multiple lipoprotein-type hyperlipidemia 08/23/2014   Cerebrovascular accident, impending ([place]) 08/23/2014   Temporomandibular joint disorders 08/23/2014   Arthropathy of ankle and foot 08/23/2014   Aftercare following right knee joint replacement surgery 08/23/2014   Diabetes mellitus type 2, uncontrolled 08/23/2014   Essential (primary) hypertension 08/23/2014   Adult BMI 30+ 08/23/2014   Status post left knee replacement 07/01/2014   Abnormal stress test 05/25/2014   HTN (hypertension) 05/25/2014    Hyperlipemia 05/25/2014   Obesity 05/25/2014   Type 2 diabetes mellitus without complication ([place]) 44/04/270   Hyperlipidemia due to type 2 diabetes mellitus ([place]) 05/25/2014   Primary osteoarthritis of left knee 05/19/2014   Primary localized osteoarthrosis, lower leg 06/30/2013    Past Surgical History:  Procedure Laterality Date   APPENDECTOMY     COLONOSCOPY  2007   repeat in 10 years   COLONOSCOPY WITH PROPOFOL N/A 06/27/2016   Procedure: COLONOSCOPY WITH PROPOFOL;  Surgeon: Lucilla Lame, MD;  Location: [place] [facility];  Service: Endoscopy;  Laterality: N/A;  Diabetic - insulin and oral meds   HERNIA REPAIR     x 2   KNEE SURGERY Right    KNEE SURGERY Left    PACEMAKER LEADLESS INSERTION N/A 04/05/2021   Procedure: PACEMAKER LEADLESS INSERTION;  Surgeon: Isaias Cowman, MD;  Location: [place] [facility];  Service: Cardiovascular;  Laterality: N/A;   POLYPECTOMY  06/27/2016   Procedure: POLYPECTOMY;  Surgeon: Lucilla Lame, MD;  Location: [place];  Service: Endoscopy;;   TONSILLECTOMY         Home Medications    Prior to Admission medications   Medication Sig Start Date End Date Taking? Authorizing Provider  benzonatate (TESSALON) 100 MG capsule Take 1 capsule (100 mg total) by mouth 3 (three) times daily as needed for cough. 01/15/22  Yes Yavier Snider, Myrene Galas, MD  fluticasone (FLONASE) 50 MCG/ACT nasal spray Place 1 spray into both nostrils daily. 01/15/22  Yes Sura Canul, Myrene Galas, MD  acetaminophen (TYLENOL) 650 MG CR tablet Take 650 mg by mouth every 8 (eight) hours as needed for pain.    [provider]  atorvastatin (LIPITOR) 20 MG tablet Take 20 mg by mouth daily. 01/05/21   [provider]  ELIQUIS 5 MG TABS tablet Take 5 mg by mouth 2 (two) times daily. 11/27/20   [provider]  empagliflozin (JARDIANCE) 25 MG TABS tablet Take 25 mg by mouth daily. Dr Maretta Bees 03/08/16   [provider]  gabapentin (NEURONTIN) 300 MG  capsule Take 300 mg by mouth at bedtime as needed (pain). O'connell    [provider]  lisinopril (ZESTRIL) 10 MG tablet Take 10 mg by mouth daily. 11/12/19   [provider]  metFORMIN (GLUCOPHAGE) 1000 MG tablet Take 1,000 mg by mouth 2 (two) times daily with a meal. 03/09/21   [provider]  metoprolol tartrate (LOPRESSOR) 25 MG tablet Take 12.5 mg by mouth 2 (two) times daily. 02/10/21   [provider]  omega-3 acid ethyl esters (LOVAZA) 1 g capsule Take 1 g by mouth daily. 09/21/20   [provider]  [employer] ULTRA test strip  09/21/20   [provider]  pioglitazone (ACTOS) 15 MG tablet Take 15 mg by mouth daily. Dr Honor Junes 01/03/17   [provider]  Semaglutide,0.25 or 0.'5MG'$ /DOS, (OZEMPIC, 0.25 OR 0.5 MG/DOSE,) 2 MG/1.5ML SOPN Inject 0.25 mg into the skin every Sunday. Dr Honor Junes 05/05/19   [provider]    Family History Family History  Problem Relation Age of Onset   COPD Father     Social History Social History   Tobacco Use   Smoking status: Former    Types: Cigarettes    Quit date: 04/01/2006    Years since quitting: 15.8   Smokeless tobacco: Never  Vaping Use   Vaping Use: Never used  Substance Use Topics   Alcohol use: Yes    Comment: occ   Drug use: No     Allergies   Patient has no known allergies.   Review of Systems Review of Systems  HENT:  Positive for congestion and sore throat. Negative for ear discharge, ear pain and facial swelling.   Respiratory:  Positive for cough. Negative for chest tightness and shortness of breath.   Musculoskeletal: Negative.   Neurological: Negative.  Negative for headaches.     Physical Exam Triage Vital Signs ED Triage Vitals  Enc Vitals Group     BP 01/15/22 0939 90/65     Pulse Rate 01/15/22 0939 (!) 103     Resp --      Temp 01/15/22 0939 98.4 F (36.9 C)     Temp Source 01/15/22 0939 Oral     SpO2 01/15/22 0939 94 %     Weight  01/15/22 0938 249 lb (112.9 kg)     Height 01/15/22 0938 '5\' 11"'$  (1.803 m)     Head Circumference --      Peak Flow --      Pain Score 01/15/22 0938 0     Pain Loc --      Pain Edu? --      Excl. in [place]? --    No data found.  Updated Vital Signs BP 90/65 (BP Location: Left Arm)   Pulse (!) 103   Temp 98.4 F (36.9 C) (Oral)   Ht '5\' 11"'$  (1.803 m)   Wt 112.9 kg   SpO2 94%   BMI 34.73 kg/m   Visual Acuity  Right Eye Distance:   Left Eye Distance:   Bilateral Distance:    Right Eye Near:   Left Eye Near:    Bilateral Near:     Physical Exam Vitals and nursing note reviewed.  Constitutional:      Appearance: He is well-developed.  Cardiovascular:     Rate and Rhythm: Normal rate and regular rhythm.     Heart sounds: Normal heart sounds.  Pulmonary:     Effort: Pulmonary effort is normal.     Breath sounds: No stridor. No wheezing, rhonchi or rales.  Abdominal:     General: Bowel sounds are normal.     Palpations: Abdomen is soft.  Neurological:     Mental Status: He is alert.     GCS: GCS eye subscore is 4. GCS verbal subscore is 5. GCS motor subscore is 6.      UC Treatments / Results  Labs (all labs ordered are listed, but only abnormal results are displayed) Labs Reviewed  SARS CORONAVIRUS 2 (TAT 6-24 HRS)    EKG   Radiology No results found.  Procedures Procedures (including critical care time)  Medications Ordered in UC Medications - No data to display  Initial Impression / Assessment and Plan / UC Course  I have reviewed the triage vital signs and the nursing notes.  Pertinent labs & imaging results that were available during my care of the patient were reviewed by me and considered in my medical decision making (see chart for details).     1.  Acute viral sinusitis with postnasal drainage: COVID-19 PCR test has been sent Fluticasone nasal spray, Tessalon Perles have been prescribed. Continue adequate hydration We will call you with  recommendations if labs are abnormal Please quarantine until lab results are available Return precautions given No indication for imaging of the chest. Final Clinical Impressions(s) / UC Diagnoses   Final diagnoses:  Acute viral sinusitis     Discharge Instructions      Maintain adequate hydration Warm salt water gargle Humidifier and vapor rub use will help with nasal congestion We will call you with recommendations if labs are abnormal Return to urgent care if you have any other concerns.    ED Prescriptions     Medication Sig Dispense Auth. Provider   fluticasone (FLONASE) 50 MCG/ACT nasal spray Place 1 spray into both nostrils daily. 16 g Nargis Abrams, Myrene Galas, MD   benzonatate (TESSALON) 100 MG capsule Take 1 capsule (100 mg total) by mouth 3 (three) times daily as needed for cough. 21 capsule Dyan Creelman, Myrene Galas, MD      PDMP not reviewed this encounter.   Chase Picket, MD 01/15/22 1046

## 2022-01-15 NOTE — Discharge Instructions (Signed)
Maintain adequate hydration Warm salt water gargle Humidifier and vapor rub use will help with nasal congestion We will call you with recommendations if labs are abnormal Return to urgent care if you have any other concerns.

## 2022-01-16 ENCOUNTER — Telehealth: Payer: Self-pay | Admitting: Physician Assistant

## 2022-01-16 ENCOUNTER — Telehealth: Payer: Self-pay | Admitting: Internal Medicine

## 2022-01-16 MED ORDER — MOLNUPIRAVIR 200 MG PO CAPS: 4.0000 | ORAL_CAPSULE | Freq: Two times a day (BID) | ORAL | 0 refills | Status: AC

## 2022-01-16 NOTE — Telephone Encounter (Signed)
+  COVID Test. Results reviewed. Sent molnupiravir.

## 2022-01-30 DIAGNOSIS — I480 Paroxysmal atrial fibrillation: Secondary | ICD-10-CM | POA: Diagnosis not present

## 2022-01-30 DIAGNOSIS — E1159 Type 2 diabetes mellitus with other circulatory complications: Secondary | ICD-10-CM | POA: Diagnosis not present

## 2022-01-30 DIAGNOSIS — Z79899 Other long term (current) drug therapy: Secondary | ICD-10-CM | POA: Diagnosis not present

## 2022-01-30 DIAGNOSIS — E1169 Type 2 diabetes mellitus with other specified complication: Secondary | ICD-10-CM | POA: Diagnosis not present

## 2022-03-13 DIAGNOSIS — E1159 Type 2 diabetes mellitus with other circulatory complications: Secondary | ICD-10-CM | POA: Diagnosis not present

## 2022-03-13 DIAGNOSIS — I152 Hypertension secondary to endocrine disorders: Secondary | ICD-10-CM | POA: Diagnosis not present

## 2022-03-13 DIAGNOSIS — I480 Paroxysmal atrial fibrillation: Secondary | ICD-10-CM | POA: Diagnosis not present

## 2022-03-15 ENCOUNTER — Encounter: Payer: Self-pay | Admitting: Urology

## 2022-03-15 ENCOUNTER — Ambulatory Visit: Payer: Federal, State, Local not specified - PPO | Admitting: Urology

## 2022-03-15 ENCOUNTER — Ambulatory Visit
Admission: RE | Admit: 2022-03-15 | Discharge: 2022-03-15 | Disposition: A | Discharge status: Home / Self Care | Payer: Federal, State, Local not specified - PPO | Source: Ambulatory Visit | Attending: Urology | Admitting: Urology

## 2022-03-15 ENCOUNTER — Ambulatory Visit
Admission: RE | Admit: 2022-03-15 | Discharge: 2022-03-15 | Disposition: A | Discharge status: Home / Self Care | Payer: Federal, State, Local not specified - PPO | Attending: Urology | Admitting: Urology

## 2022-03-15 VITALS — BP 104/74 | HR 79 | Ht 70.0 in | Wt 254.0 lb

## 2022-03-15 DIAGNOSIS — N200 Calculus of kidney: Secondary | ICD-10-CM | POA: Diagnosis not present

## 2022-03-15 DIAGNOSIS — I878 Other specified disorders of veins: Secondary | ICD-10-CM | POA: Diagnosis not present

## 2022-03-15 NOTE — Progress Notes (Signed)
I, Jeanmarie Hubert Maxie,acting as a scribe for Hollice Espy, MD.,have documented all relevant documentation on the behalf of Hollice Espy, MD,as directed by  Hollice Espy, MD while in the presence of Hollice Espy, MD.   03/15/22 3:00 PM   Darius Brown. [birth year] [id number]  Referring provider: Juline Patch, MD [address] [place],  [place] [address]  Chief Complaint  Patient presents with   Nephrolithiasis    HPI: 66 year-old male with a personal history of incidental kidney stones here for his annual follow-up.   Personally reviewed his KUB today compared to his previous KUB a year ago. He has a stable 1 cm left lower pole stone, 6 mm left mid pole stone.   He denies any flank pain.   PMH: Past Medical History:  Diagnosis Date   Diabetes mellitus without complication ([place])    Hyperlipidemia    Hypertension     Surgical History: Past Surgical History:  Procedure Laterality Date   APPENDECTOMY     COLONOSCOPY  2007   repeat in 10 years   COLONOSCOPY WITH PROPOFOL N/A 06/27/2016   Procedure: COLONOSCOPY WITH PROPOFOL;  Surgeon: Lucilla Lame, MD;  Location: [facility];  Service: Endoscopy;  Laterality: N/A;  Diabetic - insulin and oral meds   HERNIA REPAIR     x 2   KNEE SURGERY Right    KNEE SURGERY Left    PACEMAKER LEADLESS INSERTION N/A 04/05/2021   Procedure: PACEMAKER LEADLESS INSERTION;  Surgeon: Isaias Cowman, MD;  Location: [place] [facility];  Service: Cardiovascular;  Laterality: N/A;   POLYPECTOMY  06/27/2016   Procedure: POLYPECTOMY;  Surgeon: Lucilla Lame, MD;  Location: [facility];  Service: Endoscopy;;   TONSILLECTOMY      Home Medications:  Allergies as of 03/15/2022   No Known Allergies      Medication List        Accurate as of March 15, 2022  3:00 PM. If you have any questions, ask your nurse or doctor.          STOP taking these medications    acetaminophen 650 MG CR  tablet Commonly known as: TYLENOL Stopped by: [place]ice Espy, MD   benzonatate 100 MG capsule Commonly known as: TESSALON Stopped by: [place]ice Espy, MD   fluticasone 50 MCG/ACT nasal spray Commonly known as: FLONASE Stopped by: Hollice Espy, MD   gabapentin 300 MG capsule Commonly known as: NEURONTIN Stopped by: Hollice Espy, MD   omega-3 acid ethyl esters 1 g capsule Commonly known as: LOVAZA Stopped by: Hollice Espy, MD       TAKE these medications    atorvastatin 20 MG tablet Commonly known as: LIPITOR Take 20 mg by mouth daily.   Eliquis 5 MG Tabs tablet Generic drug: apixaban Take 5 mg by mouth 2 (two) times daily.   empagliflozin 25 MG Tabs tablet Commonly known as: JARDIANCE Take 25 mg by mouth daily. Dr Maretta Bees   lisinopril 10 MG tablet Commonly known as: ZESTRIL Take 10 mg by mouth daily.   loratadine 10 MG tablet Commonly known as: CLARITIN Take 10 mg by mouth daily as needed.   metFORMIN 1000 MG tablet Commonly known as: GLUCOPHAGE Take 500 mg by mouth 2 (two) times daily with a meal.   metoprolol tartrate 25 MG tablet Commonly known as: LOPRESSOR Take 12.5 mg by mouth 2 (two) times daily.   OneTouch Ultra test strip Generic drug: glucose blood   Ozempic (1 MG/DOSE)  4 MG/3ML Sopn Generic drug: Semaglutide (1 MG/DOSE) Inject 0.75 mLs into the skin every 7 (seven) days. What changed: Another medication with the same name was removed. Continue taking this medication, and follow the directions you see here. Changed by: [place] Espy, MD   pioglitazone 15 MG tablet Commonly known as: ACTOS Take 15 mg by mouth daily. Dr Honor Junes        Family History: Family History  Problem Relation Age of Onset   COPD Father     Social History:  reports that he quit smoking about 15 years ago. His smoking use included cigarettes. He has never used smokeless tobacco. He reports current alcohol use. He reports that he does not use  drugs.   Physical Exam: BP 104/74 (BP Location: Left Arm, Patient Position: Sitting, Cuff Size: Normal)   Pulse 79   Ht '5\' 10"'$  (1.778 m)   Wt 254 lb (115.2 kg)   BMI 36.45 kg/m   Constitutional:  Alert and oriented, No acute distress. HEENT: [place] AT, moist mucus membranes.  Trachea midline, no masses. Neurologic: Grossly intact, no focal deficits, moving all 4 extremities. Psychiatric: Normal mood and affect.  Laboratory Data: Lab Results  Component Value Date   WBC 7.0 10/19/2011   HGB 13.7 10/19/2011   HCT 41.0 10/19/2011   MCV 96 10/19/2011   PLT 235 10/19/2011    Lab Results  Component Value Date   CREATININE 0.98 04/06/2021    Lab Results  Component Value Date   PSA 0.4 04/21/2014    Lab Results  Component Value Date   HGBA1C 7.2 12/25/2020    Urinalysis    Component Value Date/Time   COLORURINE YELLOW 03/16/2021 1427   APPEARANCEUR CLEAR 03/16/2021 1427   APPEARANCEUR Clear 10/18/2011 1922   LABSPEC 1.010 03/16/2021 1427   LABSPEC 1.018 10/18/2011 1922   PHURINE 5.0 03/16/2021 1427   GLUCOSEU >1,000 (A) 03/16/2021 1427   GLUCOSEU Negative 10/18/2011 1922   HGBUR NEGATIVE 03/16/2021 1427   BILIRUBINUR NEGATIVE 03/16/2021 1427   BILIRUBINUR Negative 10/18/2011 1922   KETONESUR NEGATIVE 03/16/2021 1427   PROTEINUR NEGATIVE 03/16/2021 1427   NITRITE NEGATIVE 03/16/2021 1427   LEUKOCYTESUR NEGATIVE 03/16/2021 1427   LEUKOCYTESUR Negative 10/18/2011 1922    Lab Results  Component Value Date   LABMICR <3.0 03/14/2015   BACTERIA NONE SEEN 03/16/2021    Pertinent Imaging: Results for orders placed during the hospital encounter of 03/16/21  DG Abd 1 View  Narrative CLINICAL DATA:  Kidney stone  EXAM: ABDOMEN - 1 VIEW  COMPARISON:  None.  FINDINGS: 6 mm calcification in the midpole of the left kidney. 10 mm calcification in the lower pole of the left kidney. No stones over the right kidney or expected course of the ureters.  Calcified phleboliths in the lower pelvis. Nonobstructive bowel gas pattern. No organomegaly or free air.  IMPRESSION: Left nephrolithiasis.   Electronically Signed By: Rolm Baptise M.D. On: 03/18/2021 10:46  Personally reviewed   Assessment & Plan:    Left renal stone Asymptomatic, no interval growth. We discussed options, including prophylactic treatment vs conservative management. He prefers the latter, consider x-ray in a few years or sooner if he has flank pain.   No follow-ups on file.   [facility] [address], [place], [place] [address] [phone]

## 2022-04-29 DIAGNOSIS — E1169 Type 2 diabetes mellitus with other specified complication: Secondary | ICD-10-CM | POA: Diagnosis not present

## 2022-04-29 DIAGNOSIS — E1159 Type 2 diabetes mellitus with other circulatory complications: Secondary | ICD-10-CM | POA: Diagnosis not present

## 2022-04-29 DIAGNOSIS — I152 Hypertension secondary to endocrine disorders: Secondary | ICD-10-CM | POA: Diagnosis not present

## 2022-05-01 ENCOUNTER — Encounter: Payer: Self-pay | Admitting: Cardiology

## 2022-05-01 ENCOUNTER — Ambulatory Visit: Payer: Federal, State, Local not specified - PPO | Attending: Cardiology | Admitting: Cardiology

## 2022-05-01 ENCOUNTER — Ambulatory Visit (INDEPENDENT_AMBULATORY_CARE_PROVIDER_SITE_OTHER): Payer: Federal, State, Local not specified - PPO

## 2022-05-01 VITALS — BP 120/72 | HR 64 | Ht 70.0 in | Wt 250.0 lb

## 2022-05-01 DIAGNOSIS — I10 Essential (primary) hypertension: Secondary | ICD-10-CM | POA: Insufficient documentation

## 2022-05-01 DIAGNOSIS — I480 Paroxysmal atrial fibrillation: Secondary | ICD-10-CM | POA: Insufficient documentation

## 2022-05-01 NOTE — Progress Notes (Signed)
Electrophysiology Office Note:    Date:  05/01/2022   ID:  Darius Mcalpine., DOB 12-26-1955, MRN [id number]  PCP:  Juline Patch, MD  [facility] Cardiologist:  None  [facility] Electrophysiologist:  Vickie Epley, MD   Referring MD: Darius Skains, MD   Chief Complaint: Atrial fibrillation  History of Present Illness:    Darius Morino. is a 67 y.o. male who presents for an evaluation of atrial fibrillation at the request of Dr. Nehemiah Massed. Their medical history includes hypertension, paroxysmal atrial fibrillation, diabetes.  He has a leadless pacemaker that was implanted April 05, 2021 for asymptomatic sinus pauses lasting up to 4.1 seconds. He is referred to discuss possible atrial fibrillation ablation.  Today he is accompanied by his wife in clinic.  The patient tells me he has no idea when he is in or out of rhythm.  He reports being fairly active.  He takes his Eliquis for stroke prophylaxis without any off target effects.  Was previously on amiodarone but this has been stopped.    Past Medical History:  Diagnosis Date   Diabetes mellitus without complication ([place])    Hyperlipidemia    Hypertension     Past Surgical History:  Procedure Laterality Date   APPENDECTOMY     COLONOSCOPY  2007   repeat in 10 years   COLONOSCOPY WITH PROPOFOL N/A 06/27/2016   Procedure: COLONOSCOPY WITH PROPOFOL;  Surgeon: Lucilla Lame, MD;  Location: [place];  Service: Endoscopy;  Laterality: N/A;  Diabetic - insulin and oral meds   HERNIA REPAIR     x 2   KNEE SURGERY Right    KNEE SURGERY Left    PACEMAKER LEADLESS INSERTION N/A 04/05/2021   Procedure: PACEMAKER LEADLESS INSERTION;  Surgeon: Isaias Cowman, MD;  Location: [place] [facility];  Service: Cardiovascular;  Laterality: N/A;   POLYPECTOMY  06/27/2016   Procedure: POLYPECTOMY;  Surgeon: Lucilla Lame, MD;  Location: [facility];  Service: Endoscopy;;   TONSILLECTOMY      Current  Medications: Current Meds  Medication Sig   atorvastatin (LIPITOR) 20 MG tablet Take 20 mg by mouth daily.   ELIQUIS 5 MG TABS tablet Take 5 mg by mouth 2 (two) times daily.   empagliflozin (JARDIANCE) 25 MG TABS tablet Take 25 mg by mouth daily. Dr Maretta Bees   lisinopril (ZESTRIL) 10 MG tablet Take 10 mg by mouth daily.   loratadine (CLARITIN) 10 MG tablet Take 10 mg by mouth daily as needed.   metoprolol tartrate (LOPRESSOR) 25 MG tablet Take 12.5 mg by mouth 2 (two) times daily.   ONETOUCH ULTRA test strip    OZEMPIC, 1 MG/DOSE, 4 MG/3ML SOPN Inject 0.75 mLs into the skin every 7 (seven) days.   pioglitazone (ACTOS) 15 MG tablet Take 15 mg by mouth daily. Dr Honor Junes     Allergies:   Patient has no known allergies.   Social History   Socioeconomic History   Marital status: Married    Spouse name: Not on file   Number of children: Not on file   Years of education: Not on file   Highest education level: Not on file  Occupational History   Not on file  Tobacco Use   Smoking status: Former    Types: Cigarettes    Quit date: 04/01/2006    Years since quitting: 16.0   Smokeless tobacco: Never  Vaping Use   Vaping Use: Never used  Substance and Sexual Activity  Alcohol use: Yes    Comment: occ   Drug use: No   Sexual activity: Yes  Other Topics Concern   Not on file  Social History Narrative   Not on file   Social Determinants of Health   Financial Resource Strain: Not on file  Food Insecurity: Not on file  Transportation Needs: Not on file  Physical Activity: Not on file  Stress: Not on file  Social Connections: Not on file     Family History: The patient's family history includes COPD in his father.  ROS:   Please see the history of present illness.    All other systems reviewed and are negative.  EKGs/Labs/Other Studies Reviewed:    The following studies were reviewed today:  April 26, 2020 echo EF 55%  EKG:  The ekg ordered today demonstrates sinus  rhythm with an incomplete right bundle-branch block, left anterior fascicular block and a first-degree AV delay   Recent Labs: No results found for requested labs within last 365 days.  Recent Lipid Panel    Component Value Date/Time   CHOL 145 12/25/2020 0000   CHOL 140 03/21/2016 0842   CHOL 145 10/18/2011 1922   TRIG 172 (A) 12/25/2020 0000   TRIG 153 10/18/2011 1922   HDL 44 12/25/2020 0000   HDL 40 03/21/2016 0842   HDL 37 (L) 10/18/2011 1922   CHOLHDL 3.5 03/21/2016 0842   VLDL 31 10/18/2011 1922   LDLCALC 72 12/25/2020 0000   LDLCALC 76 03/21/2016 0842   LDLCALC 77 10/18/2011 1922    Physical Exam:    VS:  BP 120/72   Pulse 64   Ht '5\' 10"'$  (1.778 m)   Wt 250 lb (113.4 kg)   SpO2 95%   BMI 35.87 kg/m     Wt Readings from Last 3 Encounters:  05/01/22 250 lb (113.4 kg)  03/15/22 254 lb (115.2 kg)  01/15/22 249 lb (112.9 kg)     GEN:  Well nourished, well developed in no acute distress.  Obese CARDIAC: RRR, no murmurs, rubs, gallops RESPIRATORY:  Clear to auscultation without rales, wheezing or rhonchi        ASSESSMENT:    1. Paroxysmal atrial fibrillation (HCC)   2. Primary hypertension    PLAN:    In order of problems listed above:  #Paroxysmal atrial fibrillation Asymptomatic On Eliquis for stroke prophylaxis Previously on amiodarone but this has been stopped.  It is unclear to me what the burden of his atrial fibrillation is.  He has a history of a normal ejection fraction.  I would like to start with a 2-week ZIO monitor to assess the burden of atrial fibrillation.  We will use this to help US guide medical therapy.  I will see him back in clinic 6 to 8 weeks from now after he is on the ZIO monitor.  #Sick sinus syndrome  #Permanent pacemaker in situ Patient with a leadless pacemaker implanted January 2023 for pauses up to 4 seconds.  Follow-up in 6 to 8 weeks with me.   Medication Adjustments/Labs and Tests Ordered: Current medicines are  reviewed at length with the patient today.  Concerns regarding medicines are outlined above.  Orders Placed This Encounter  Procedures   LONG TERM MONITOR (3-14 DAYS)   EKG 12-Lead   No orders of the defined types were placed in this encounter.    Signed, Hilton Cork. Quentin Ore, MD, [facility] 05/01/2022 4:55 PM    Electrophysiology  [facility]

## 2022-05-01 NOTE — Patient Instructions (Signed)
Medication Instructions:  Your physician recommends that you continue on your current medications as directed. Please refer to the Current Medication list given to you today.  *If you need a refill on your cardiac medications before your next appointment, please call your pharmacy*  Testing/Procedures: Your physician has recommended that you wear an event monitor. Event monitors are medical devices that record the heart's electrical activity. Doctors most often [place] these monitors to diagnose arrhythmias. Arrhythmias are problems with the speed or rhythm of the heartbeat. The monitor is a small, portable device. You can wear one while you do your normal daily activities. This is usually used to diagnose what is causing palpitations/syncope (passing out).  Follow-Up: At [facility], you and your health needs are our priority.  As part of our continuing mission to provide you with exceptional heart care, we have created designated Provider Care Teams.  These Care Teams include your primary Cardiologist (physician) and Advanced Practice Providers (APPs -  Physician Assistants and Nurse Practitioners) who all work together to provide you with the care you need, when you need it.  Your next appointment:   6-8 week(s)  Provider:   Lars Mage, MD    Other Instructions [place] [place]- Long Term Monitor Instructions  Your physician has requested you wear a ZIO patch monitor for 14 days.  This is a single patch monitor. [employer] supplies one patch monitor per enrollment. Additional stickers are not available. Please do not apply patch if you will be having a Nuclear Stress Test,  Echocardiogram, Cardiac CT, MRI, or Chest Xray during the period you would be wearing the  monitor. The patch cannot be worn during these tests. You cannot remove and re-apply the  ZIO XT patch monitor.  Your ZIO patch monitor will be mailed 3 day USPS to your address on file. It may take 3-5 days  to receive your  monitor after you have been enrolled.  Once you have received your monitor, please review the enclosed instructions. Your monitor  has already been registered assigning a specific monitor serial # to you.  Billing and Patient Assistance Program Information  We have supplied [employer] with any of your insurance information on file for billing purposes. [employer] offers a sliding scale Patient Assistance Program for patients that do not have  insurance, or whose insurance does not completely cover the cost of the ZIO monitor.  You must apply for the Patient Assistance Program to qualify for this discounted rate.  To apply, please call [employer] at [phone], select option 4, select option 2, ask to apply for  Patient Assistance Program. Theodore Demark will ask your household income, and how many people  are in your household. They will quote your out-of-pocket cost based on that information.  [employer] will also be able to set up a 34-month interest-free payment plan if needed.  Applying the monitor   Shave hair from upper left chest.  Hold abrader disc by orange tab. Rub abrader in 40 strokes over the upper left chest as  indicated in your monitor instructions.  Clean area with 4 enclosed alcohol pads. Let dry.  Apply patch as indicated in monitor instructions. Patch will be placed under collarbone on left  side of chest with arrow pointing upward.  Rub patch adhesive wings for 2 minutes. Remove white label marked "1". Remove the white  label marked "2". Rub patch adhesive wings for 2 additional minutes.  While looking in a mirror, press and release button in center of patch.  A small green light will  flash 3-4 times. This will be your only indicator that the monitor has been turned on.  Do not shower for the first 24 hours. You may shower after the first 24 hours.  Press the button if you feel a symptom. You will hear a small click. Record Date, Time and  Symptom in the Patient Logbook.  When you  are ready to remove the patch, follow instructions on the last 2 pages of Patient  Logbook. Stick patch monitor onto the last page of Patient Logbook.  Place Patient Logbook in the blue and white box. Use locking tab on box and tape box closed  securely. The blue and white box has prepaid postage on it. Please place it in the mailbox as  soon as possible. Your physician should have your test results approximately 7 days after the  monitor has been mailed back to [facility].  Call [place] at [phone] if you have questions regarding  your ZIO XT patch monitor. Call them immediately if you see an orange light blinking on your  monitor.  If your monitor falls off in less than 4 days, contact our Monitor department at [phone].  If your monitor becomes loose or falls off after 4 days call [employer] at [phone] for  suggestions on securing your monitor

## 2022-05-05 DIAGNOSIS — I10 Essential (primary) hypertension: Secondary | ICD-10-CM

## 2022-05-05 DIAGNOSIS — I480 Paroxysmal atrial fibrillation: Secondary | ICD-10-CM | POA: Diagnosis not present

## 2022-05-24 ENCOUNTER — Telehealth: Payer: Self-pay | Admitting: Cardiology

## 2022-05-24 NOTE — Telephone Encounter (Signed)
New Message:      Patient says he is scheduled to see Dr Quentin Ore on 05-29-22. He wants to know if his Monitor results will be ready for his appointment on 32-28-24? If they are not ready, he said he will need to reschedule that appointment.

## 2022-05-24 NOTE — Telephone Encounter (Signed)
Patient wore a heart monitor and mailed in back on Tuesday 2/20. He would like to know if we will have the results back in time for his appointment with Dr. Quentin Ore on 2/28. Advised that we do not have the results yet but I will check with our monitor team to see if we will be able to get them by the time of his appointment. Advised that I will call him back next week with an update.

## 2022-05-29 ENCOUNTER — Ambulatory Visit: Payer: Medicare Other | Admitting: Cardiology

## 2022-06-12 DIAGNOSIS — I480 Paroxysmal atrial fibrillation: Secondary | ICD-10-CM | POA: Diagnosis not present

## 2022-06-12 DIAGNOSIS — I455 Other specified heart block: Secondary | ICD-10-CM | POA: Diagnosis not present

## 2022-06-14 DIAGNOSIS — G4733 Obstructive sleep apnea (adult) (pediatric): Secondary | ICD-10-CM | POA: Diagnosis not present

## 2022-06-23 NOTE — Progress Notes (Unsigned)
Electrophysiology Office Follow up Visit Note:    Date:  06/26/2022   ID:  Darius Brown., DOB 1955-05-15, MRN [id number]  PCP:  Juline Patch, MD  [facility] Cardiologist:  None  [facility] Electrophysiologist:  Vickie Epley, MD    Interval History:    Darius Brown. is a 67 y.o. male who presents for a follow up visit.   I saw the patient May 01, 2022.  I saw the patient for his history of paroxysmal atrial fibrillation.  He is on Eliquis.  We ordered a 2-week ZIO monitor to assess the burden of atrial fibrillation before deciding upon a treatment strategy.  The patient has a leadless pacemaker that was implanted in 2023 for pauses up to 4 seconds.  He is with his wife today in clinic who I previously met.  Today we reviewed the results of his heart monitor.  He is interested in avoiding long-term medication use if at all possible.      Past medical, surgical, social and family history were reviewed.  ROS:   Please see the history of present illness.    All other systems reviewed and are negative.  EKGs/Labs/Other Studies Reviewed:    The following studies were reviewed today:  June 02, 2022 ZIO monitor personally reviewed shows 55% burden of atrial fibrillation with average ventricular rate of 94 bpm.  His symptom triggers correspond to atrial fibrillation episodes.   Physical Exam:    VS:  BP 118/70   Pulse 90   Ht 5\' 10"  (1.778 m)   Wt 249 lb (112.9 kg)   SpO2 98%   BMI 35.73 kg/m     Wt Readings from Last 3 Encounters:  06/26/22 249 lb (112.9 kg)  05/01/22 250 lb (113.4 kg)  03/15/22 254 lb (115.2 kg)     GEN:  Well nourished, well developed in no acute distress CARDIAC: RRR, no murmurs, rubs, gallops RESPIRATORY:  Clear to auscultation without rales, wheezing or rhonchi       ASSESSMENT:    1. Paroxysmal atrial fibrillation (HCC)   2. Primary hypertension   3. Sinus node dysfunction (HCC)   4. Cardiac pacemaker in  situ    PLAN:    In order of problems listed above:   #Paroxysmal atrial fibrillation  55% burden on recent ZIO monitor.  Discussed treatment options today for their AF including antiarrhythmic drug therapy and ablation. Discussed risks, recovery and likelihood of success. Discussed potential need for repeat ablation procedures and antiarrhythmic drugs after an initial ablation. They wish to proceed with scheduling.  Risk, benefits, and alternatives to EP study and radiofrequency ablation for afib were also discussed in detail today. These risks include but are not limited to stroke, bleeding, vascular damage, tamponade, perforation, damage to the esophagus, lungs, and other structures, pulmonary vein stenosis, worsening renal function, and death. The patient understands these risk and wishes to proceed.  We will therefore proceed with catheter ablation at the next available time.  Carto, ICE, anesthesia are requested for the procedure.  Will also obtain CT PV protocol prior to the procedure to exclude LAA thrombus and further evaluate atrial anatomy.  #Hypertension At goal today.  Recommend checking blood pressures 1-2 times per week at home and recording the values.  Recommend bringing these recordings to the primary care physician.  #Sinus node dysfunction Pacemaker was recently interrogated at [facility] clinic was found to be in good working order.  He would like to transition  his remote monitoring to our clinic.  We will arrange for this today.  Total time of encounter: 47 minutes total time of encounter, including face-to-face patient care, coordination of care and counseling regarding high complexity medical decision making re: AF.      Signed, Lars Mage, MD, [facility], [facility] 06/26/2022 11:30 AM    Electrophysiology [place]

## 2022-06-26 ENCOUNTER — Encounter: Payer: Self-pay | Admitting: Cardiology

## 2022-06-26 ENCOUNTER — Ambulatory Visit: Payer: Federal, State, Local not specified - PPO | Attending: Cardiology | Admitting: Cardiology

## 2022-06-26 VITALS — BP 118/70 | HR 90 | Ht 70.0 in | Wt 249.0 lb

## 2022-06-26 DIAGNOSIS — Z950 Presence of cardiac pacemaker: Secondary | ICD-10-CM

## 2022-06-26 DIAGNOSIS — I495 Sick sinus syndrome: Secondary | ICD-10-CM | POA: Diagnosis not present

## 2022-06-26 DIAGNOSIS — I480 Paroxysmal atrial fibrillation: Secondary | ICD-10-CM | POA: Diagnosis not present

## 2022-06-26 DIAGNOSIS — I10 Essential (primary) hypertension: Secondary | ICD-10-CM

## 2022-06-26 NOTE — Patient Instructions (Addendum)
Medication Instructions:  Your physician recommends that you continue on your current medications as directed. Please refer to the Current Medication list given to you today.  *If you need a refill on your cardiac medications before your next appointment, please call your pharmacy*  Lab Work: BMET and CBC prior to your CT scan and ablation You will get your lab work at [facility] hospital.  Your lab work will be done at the [facility] next to [employer]. These are walk in labs- you will not need an appointment and you do not need to be fasting.    Testing/Procedures: Your physician has requested that you have cardiac CT. Cardiac computed tomography (CT) is a painless test that uses an x-ray machine to take clear, detailed pictures of your heart. For further information please visit [employer]. Please follow instruction sheet as given. This will be done about one week prior to your ablation. We will call you to schedule.   Your physician has recommended that you have an ablation. Catheter ablation is a medical procedure used to treat some cardiac arrhythmias (irregular heartbeats). During catheter ablation, a long, thin, flexible tube is put into a blood vessel in your groin (upper thigh), or neck. This tube is called an ablation catheter. It is then guided to your heart through the blood vessel. Radio frequency waves destroy small areas of heart tissue where abnormal heartbeats may cause an arrhythmia to start. Please see the instruction sheet given to you today. You are scheduled for Atrial Fibrillation Ablation on Monday, July 15 with Dr. Lars Mage.Please arrive at the Main Entrance A at [facility]: [place], [place] [address] at 8:30 AM   Follow-Up: At [facility], you and your health needs are our priority.  As part of our continuing mission to provide you with exceptional heart care, we have created designated Provider Care  Teams.  These Care Teams include your primary Cardiologist (physician) and Advanced Practice Providers (APPs -  Physician Assistants and Nurse Practitioners) who all work together to provide you with the care you need, when you need it.  Your next appointment:   We will call you to arrange your follow up appointments.

## 2022-07-11 DIAGNOSIS — R0602 Shortness of breath: Secondary | ICD-10-CM | POA: Diagnosis not present

## 2022-07-11 DIAGNOSIS — G4733 Obstructive sleep apnea (adult) (pediatric): Secondary | ICD-10-CM | POA: Diagnosis not present

## 2022-07-16 ENCOUNTER — Telehealth: Payer: Self-pay | Admitting: Cardiology

## 2022-07-16 MED ORDER — ELIQUIS 5 MG PO TABS: 5.0000 mg | ORAL_TABLET | Freq: Two times a day (BID) | ORAL | 0 refills | Status: DC

## 2022-07-16 MED ORDER — ELIQUIS 5 MG PO TABS: 5.0000 mg | ORAL_TABLET | Freq: Two times a day (BID) | ORAL | 1 refills | Status: DC

## 2022-07-16 NOTE — Telephone Encounter (Signed)
*  STAT* If patient is at the pharmacy, call can be transferred to refill team.   1. Which medications need to be refilled? (please list name of each medication and dose if known) ELIQUIS 5 MG TABS tablet   2. Which pharmacy/location (including street and city if local pharmacy) is medication to be sent to?  [id number] - [place], [place] - [address]    3. Do they need a 30 day or 90 day supply? 7     *STAT* If patient is at the pharmacy, call can be transferred to refill team.   1. Which medications need to be refilled? (please list name of each medication and dose if known) ELIQUIS 5 MG TABS tablet  2. Which pharmacy/location (including street and city if local pharmacy) is medication to be sent to? [facility] - [place], [place] - [address] [facility] AT Portal to Registered [facility]   3. Do they need a 30 day or 90 day supply? 90

## 2022-07-16 NOTE — Telephone Encounter (Signed)
Refill request

## 2022-07-16 NOTE — Telephone Encounter (Signed)
Pt last saw Dr Lalla Brothers 06/26/22, last labs 12/21/21 Creat 1.0, age 67, weight 112.9kg, based on specified criteria pt is on appropriate dosage of Eliquis  BID for afib.  Will refill rx.

## 2022-07-26 ENCOUNTER — Telehealth: Payer: Self-pay | Admitting: Cardiology

## 2022-07-26 NOTE — Telephone Encounter (Signed)
Spoke with the patient and rescheduled his procedure to 5/13.

## 2022-07-26 NOTE — Telephone Encounter (Signed)
Pt states he is returning call to sch sooner ablation

## 2022-08-04 NOTE — Pre-Procedure Instructions (Signed)
Spoke with patient regarding Ozempic and Jardiance.  He takes Ozempic on Sundays he will take today, he will not take on 5/12.  Jardiance last dose will be 5/9.  He also states he is now taking Lasix 20mg  daily, added to his home meds.

## 2022-08-05 ENCOUNTER — Other Ambulatory Visit
Admission: RE | Admit: 2022-08-05 | Discharge: 2022-08-05 | Disposition: A | Discharge status: Home / Self Care | Payer: Federal, State, Local not specified - PPO | Attending: Cardiology | Admitting: Cardiology

## 2022-08-05 DIAGNOSIS — I480 Paroxysmal atrial fibrillation: Secondary | ICD-10-CM | POA: Insufficient documentation

## 2022-08-05 DIAGNOSIS — I10 Essential (primary) hypertension: Secondary | ICD-10-CM

## 2022-08-05 LAB — CBC WITH DIFFERENTIAL/PLATELET
Abs Immature Granulocytes: 0.02 10*3/uL (ref 0.00–0.07)
Basophils Absolute: 0.1 10*3/uL (ref 0.0–0.1)
Basophils Relative: 1 %
Eosinophils Absolute: 0.1 10*3/uL (ref 0.0–0.5)
Eosinophils Relative: 1 %
HCT: 48.7 % (ref 39.0–52.0)
Hemoglobin: 15.8 g/dL (ref 13.0–17.0)
Immature Granulocytes: 0 %
Lymphocytes Relative: 34 %
Lymphs Abs: 3.1 10*3/uL (ref 0.7–4.0)
MCH: 32.2 pg (ref 26.0–34.0)
MCHC: 32.4 g/dL (ref 30.0–36.0)
MCV: 99.2 fL (ref 80.0–100.0)
Monocytes Absolute: 0.7 10*3/uL (ref 0.1–1.0)
Monocytes Relative: 8 %
Neutro Abs: 5.1 10*3/uL (ref 1.7–7.7)
Neutrophils Relative %: 56 %
Platelets: 265 10*3/uL (ref 150–400)
RBC: 4.91 MIL/uL (ref 4.22–5.81)
RDW: 12.2 % (ref 11.5–15.5)
WBC: 9 10*3/uL (ref 4.0–10.5)
nRBC: 0 % (ref 0.0–0.2)

## 2022-08-05 LAB — BASIC METABOLIC PANEL
Anion gap: 9 (ref 5–15)
BUN: 27 mg/dL — ABNORMAL HIGH (ref 8–23)
CO2: 24 mmol/L (ref 22–32)
Calcium: 8.9 mg/dL (ref 8.9–10.3)
Chloride: 104 mmol/L (ref 98–111)
Creatinine, Ser: 0.93 mg/dL (ref 0.61–1.24)
GFR, Estimated: >60 mL/min (ref >60)
Glucose, Bld: 145 mg/dL — ABNORMAL HIGH (ref 70–99)
Potassium: 4.3 mmol/L (ref 3.5–5.1)
Sodium: 137 mmol/L (ref 135–145)

## 2022-08-08 ENCOUNTER — Telehealth (HOSPITAL_COMMUNITY): Payer: Self-pay | Admitting: *Deleted

## 2022-08-08 NOTE — Telephone Encounter (Signed)
Reaching out to patient to offer assistance regarding upcoming cardiac imaging study; pt verbalizes understanding of appt date/time, parking situation and where to check in, pre-test NPO status  and verified current allergies; name and call back number provided for further questions should they arise ? ?Lilliana Turner RN Navigator Cardiac Imaging ?[facility] ?[phone] office ?[phone] cell ? ?

## 2022-08-09 ENCOUNTER — Ambulatory Visit
Admission: RE | Admit: 2022-08-09 | Discharge: 2022-08-09 | Disposition: A | Discharge status: Home / Self Care | Payer: Federal, State, Local not specified - PPO | Source: Ambulatory Visit | Attending: Cardiology | Admitting: Cardiology

## 2022-08-09 DIAGNOSIS — I10 Essential (primary) hypertension: Secondary | ICD-10-CM | POA: Insufficient documentation

## 2022-08-09 DIAGNOSIS — I480 Paroxysmal atrial fibrillation: Secondary | ICD-10-CM | POA: Insufficient documentation

## 2022-08-09 MED ORDER — IOHEXOL 350 MG/ML SOLN
100.0000 mL | Freq: Once | INTRAVENOUS | Status: AC | PRN
  Administered 2022-08-09: 100 mL via INTRAVENOUS

## 2022-08-09 NOTE — Pre-Procedure Instructions (Signed)
Instructed patient on the following items: Arrival time 1130 Nothing to eat or drink after midnight No meds AM of procedure Responsible person to drive you home and stay with you for 24 hrs  Have you missed any doses of anti-coagulant Eliquis- takes twice a day, hasn't missed any doses.  Don't take on Monday.  Last Ozempic was 5/5.  Last Jardiance was 5/9.

## 2022-08-12 ENCOUNTER — Ambulatory Visit (HOSPITAL_COMMUNITY)
Admission: RE | Admit: 2022-08-12 | Discharge: 2022-08-12 | Disposition: A | Discharge status: Home / Self Care | Payer: Federal, State, Local not specified - PPO | Attending: Cardiology | Admitting: Cardiology

## 2022-08-12 ENCOUNTER — Other Ambulatory Visit (HOSPITAL_COMMUNITY): Payer: Self-pay

## 2022-08-12 ENCOUNTER — Ambulatory Visit (HOSPITAL_COMMUNITY): Payer: Federal, State, Local not specified - PPO | Admitting: Anesthesiology

## 2022-08-12 ENCOUNTER — Ambulatory Visit (HOSPITAL_COMMUNITY)
Admission: RE | Disposition: A | Discharge status: Home / Self Care | Payer: Self-pay | Source: Home / Self Care | Attending: Cardiology

## 2022-08-12 DIAGNOSIS — Z950 Presence of cardiac pacemaker: Secondary | ICD-10-CM | POA: Insufficient documentation

## 2022-08-12 DIAGNOSIS — E119 Type 2 diabetes mellitus without complications: Secondary | ICD-10-CM | POA: Diagnosis not present

## 2022-08-12 DIAGNOSIS — Z7984 Long term (current) use of oral hypoglycemic drugs: Secondary | ICD-10-CM

## 2022-08-12 DIAGNOSIS — Z87891 Personal history of nicotine dependence: Secondary | ICD-10-CM | POA: Diagnosis not present

## 2022-08-12 DIAGNOSIS — Z7901 Long term (current) use of anticoagulants: Secondary | ICD-10-CM | POA: Insufficient documentation

## 2022-08-12 DIAGNOSIS — I480 Paroxysmal atrial fibrillation: Secondary | ICD-10-CM | POA: Insufficient documentation

## 2022-08-12 DIAGNOSIS — I4891 Unspecified atrial fibrillation: Secondary | ICD-10-CM

## 2022-08-12 DIAGNOSIS — I10 Essential (primary) hypertension: Secondary | ICD-10-CM | POA: Insufficient documentation

## 2022-08-12 HISTORY — PX: ATRIAL FIBRILLATION ABLATION: EP1191

## 2022-08-12 LAB — POCT ACTIVATED CLOTTING TIME
Activated Clotting Time: 266 seconds
Activated Clotting Time: 298 seconds

## 2022-08-12 LAB — GLUCOSE, CAPILLARY
Glucose-Capillary: 160 mg/dL — ABNORMAL HIGH (ref 70–99)
Glucose-Capillary: 145 mg/dL — ABNORMAL HIGH (ref 70–99)

## 2022-08-12 SURGERY — ATRIAL FIBRILLATION ABLATION
Anesthesia: General

## 2022-08-12 MED ORDER — PANTOPRAZOLE SODIUM 40 MG PO TBEC
40.0000 mg | DELAYED_RELEASE_TABLET | Freq: Every day | ORAL | 0 refills | Status: DC
  Filled 2022-08-12: qty 45, 45d supply, fill #0

## 2022-08-12 MED ORDER — ALBUMIN HUMAN 5 % IV SOLN: INTRAVENOUS | Status: DC | PRN

## 2022-08-12 MED ORDER — PHENYLEPHRINE 80 MCG/ML (10ML) SYRINGE FOR IV PUSH (FOR BLOOD PRESSURE SUPPORT)
PREFILLED_SYRINGE | INTRAVENOUS | Status: DC | PRN
  Administered 2022-08-12 (×4): 160 ug via INTRAVENOUS
  Administered 2022-08-12 (×2): 80 ug via INTRAVENOUS
  Administered 2022-08-12 (×2): 160 ug via INTRAVENOUS

## 2022-08-12 MED ORDER — LIDOCAINE 2% (20 MG/ML) 5 ML SYRINGE
INTRAMUSCULAR | Status: DC | PRN
  Administered 2022-08-12: 100 mg via INTRAVENOUS

## 2022-08-12 MED ORDER — SUGAMMADEX SODIUM 200 MG/2ML IV SOLN
INTRAVENOUS | Status: DC | PRN
  Administered 2022-08-12: 200 mg via INTRAVENOUS

## 2022-08-12 MED ORDER — ACETAMINOPHEN 500 MG PO TABS
1000.0000 mg | ORAL_TABLET | Freq: Once | ORAL | Status: AC
  Administered 2022-08-12: 1000 mg via ORAL
  Filled 2022-08-12: qty 2

## 2022-08-12 MED ORDER — SODIUM CHLORIDE 0.9 % IV SOLN: INTRAVENOUS | Status: DC

## 2022-08-12 MED ORDER — FENTANYL CITRATE (PF) 100 MCG/2ML IJ SOLN
INTRAMUSCULAR | Status: AC
  Filled 2022-08-12: qty 2

## 2022-08-12 MED ORDER — PANTOPRAZOLE SODIUM 40 MG PO TBEC
40.0000 mg | DELAYED_RELEASE_TABLET | Freq: Every day | ORAL | Status: DC
  Administered 2022-08-12: 40 mg via ORAL
  Filled 2022-08-12: qty 1

## 2022-08-12 MED ORDER — HEPARIN SODIUM (PORCINE) 1000 UNIT/ML IJ SOLN
INTRAMUSCULAR | Status: DC | PRN
  Administered 2022-08-12: 4000 [IU] via INTRAVENOUS
  Administered 2022-08-12: 5000 [IU] via INTRAVENOUS
  Administered 2022-08-12: 16000 [IU] via INTRAVENOUS

## 2022-08-12 MED ORDER — CEFAZOLIN SODIUM-DEXTROSE 2-4 GM/100ML-% IV SOLN
INTRAVENOUS | Status: AC
  Filled 2022-08-12: qty 100

## 2022-08-12 MED ORDER — ONDANSETRON HCL 4 MG/2ML IJ SOLN
INTRAMUSCULAR | Status: DC | PRN
  Administered 2022-08-12: 4 mg via INTRAVENOUS

## 2022-08-12 MED ORDER — HEPARIN SODIUM (PORCINE) 1000 UNIT/ML IJ SOLN
INTRAMUSCULAR | Status: DC | PRN
  Administered 2022-08-12: 1000 [IU] via INTRAVENOUS

## 2022-08-12 MED ORDER — PROPOFOL 10 MG/ML IV BOLUS
INTRAVENOUS | Status: DC | PRN
  Administered 2022-08-12: 140 mg via INTRAVENOUS

## 2022-08-12 MED ORDER — PHENYLEPHRINE HCL-NACL 20-0.9 MG/250ML-% IV SOLN
INTRAVENOUS | Status: DC | PRN
  Administered 2022-08-12: 30 ug/min via INTRAVENOUS

## 2022-08-12 MED ORDER — FENTANYL CITRATE (PF) 250 MCG/5ML IJ SOLN
INTRAMUSCULAR | Status: DC | PRN
  Administered 2022-08-12: 50 ug via INTRAVENOUS

## 2022-08-12 MED ORDER — APIXABAN 5 MG PO TABS
5.0000 mg | ORAL_TABLET | Freq: Two times a day (BID) | ORAL | Status: DC
  Administered 2022-08-12: 5 mg via ORAL
  Filled 2022-08-12: qty 1

## 2022-08-12 MED ORDER — HEPARIN (PORCINE) IN NACL 1000-0.9 UT/500ML-% IV SOLN
INTRAVENOUS | Status: DC | PRN
  Administered 2022-08-12 (×3): 500 mL

## 2022-08-12 MED ORDER — COLCHICINE 0.6 MG PO TABS
0.6000 mg | ORAL_TABLET | Freq: Two times a day (BID) | ORAL | Status: DC
  Administered 2022-08-12: 0.6 mg via ORAL
  Filled 2022-08-12: qty 1

## 2022-08-12 MED ORDER — CEFAZOLIN SODIUM-DEXTROSE 2-3 GM-%(50ML) IV SOLR
INTRAVENOUS | Status: DC | PRN
  Administered 2022-08-12: 2 g via INTRAVENOUS

## 2022-08-12 MED ORDER — COLCHICINE 0.6 MG PO TABS
0.6000 mg | ORAL_TABLET | Freq: Two times a day (BID) | ORAL | 0 refills | Status: DC
  Filled 2022-08-12: qty 10, 5d supply, fill #0

## 2022-08-12 MED ORDER — HEPARIN SODIUM (PORCINE) 1000 UNIT/ML IJ SOLN
INTRAMUSCULAR | Status: AC
  Filled 2022-08-12: qty 10

## 2022-08-12 MED ORDER — DEXAMETHASONE SODIUM PHOSPHATE 10 MG/ML IJ SOLN
INTRAMUSCULAR | Status: DC | PRN
  Administered 2022-08-12: 10 mg via INTRAVENOUS

## 2022-08-12 MED ORDER — ROCURONIUM BROMIDE 10 MG/ML (PF) SYRINGE
PREFILLED_SYRINGE | INTRAVENOUS | Status: DC | PRN
  Administered 2022-08-12: 60 mg via INTRAVENOUS

## 2022-08-12 MED ORDER — PROTAMINE SULFATE 10 MG/ML IV SOLN
INTRAVENOUS | Status: DC | PRN
  Administered 2022-08-12: 35 mg via INTRAVENOUS
  Administered 2022-08-12: 10 mg via INTRAVENOUS

## 2022-08-12 SURGICAL SUPPLY — 19 items
BAG SNAP BAND KOVER 36X36 (MISCELLANEOUS) IMPLANT
BLANKET WARM UNDERBOD FULL ACC (MISCELLANEOUS) ×1 IMPLANT
CATH ABLAT QDOT MICRO BI TC DF (CATHETERS) IMPLANT
CATH OCTARAY 2.0 F 3-3-3-3-3 (CATHETERS) IMPLANT
CATH S-M CIRCA TEMP PROBE (CATHETERS) IMPLANT
CATH SOUNDSTAR ECO 8FR (CATHETERS) IMPLANT
CATH WEB BI DIR CSDF CRV REPRO (CATHETERS) IMPLANT
CLOSURE PERCLOSE PROSTYLE (VASCULAR PRODUCTS) IMPLANT
COVER SWIFTLINK CONNECTOR (BAG) ×1 IMPLANT
PACK EP LATEX FREE (CUSTOM PROCEDURE TRAY) ×1
PACK EP LF (CUSTOM PROCEDURE TRAY) ×1 IMPLANT
PAD DEFIB RADIO PHYSIO CONN (PAD) ×1 IMPLANT
PATCH CARTO3 (PAD) IMPLANT
SHEATH BAYLIS TRANSSEPTAL 98CM (NEEDLE) IMPLANT
SHEATH CARTO VIZIGO SM CVD (SHEATH) IMPLANT
SHEATH PINNACLE 8F 10CM (SHEATH) IMPLANT
SHEATH PINNACLE 9F 10CM (SHEATH) IMPLANT
SHEATH PROBE COVER 6X72 (BAG) IMPLANT
TUBING SMART ABLATE COOLFLOW (TUBING) IMPLANT

## 2022-08-12 NOTE — Transfer of Care (Signed)
Immediate Anesthesia Transfer of Care Note  Patient: Darius Brown.  Procedure(s) Performed: ATRIAL FIBRILLATION ABLATION  Patient Location: Cath Lab  Anesthesia Type:General  Level of Consciousness: awake, alert , and oriented  Airway & Oxygen Therapy: Patient Spontanous Breathing and Patient connected to nasal cannula oxygen  Post-op Assessment: Report given to RN and Post -op Vital signs reviewed and stable  Post vital signs: Reviewed and stable  Last Vitals:  Vitals Value Taken Time  BP 101/62 08/12/22 1545  Temp    Pulse 82 08/12/22 1546  Resp 16 08/12/22 1546  SpO2 95 % 08/12/22 1546  Vitals shown include unvalidated device data.  Last Pain:  Vitals:   08/12/22 1208  TempSrc:   PainSc: 0-No pain         Complications: There were no known notable events for this encounter.

## 2022-08-12 NOTE — H&P (Signed)
Electrophysiology Office Follow up Visit Note:     Date:  08/12/2022    ID:  [name]., DOB March 28, 1956, MRN [id number]   PCP:  Duanne Limerick, MD   [facility] Cardiologist:  None  [facility] Electrophysiologist:  Lanier Prude, MD      Interval History:     Nai Matel. is a 67 y.o. male who presents for a follow up visit.    I saw the patient May 01, 2022.  I saw the patient for his history of paroxysmal atrial fibrillation.  He is on Eliquis.  We ordered a 2-week ZIO monitor to assess the burden of atrial fibrillation before deciding upon a treatment strategy.  The patient has a leadless pacemaker that was implanted in 2023 for pauses up to 4 seconds.   He is with his wife today in clinic who I previously met.  Today we reviewed the results of his heart monitor.  He is interested in avoiding long-term medication use if at all possible.    Presents today for PVI. Procedure reviewed.    Objective  Past medical, surgical, social and family history were reviewed.   ROS:   Please see the history of present illness.    All other systems reviewed and are negative.   EKGs/Labs/Other Studies Reviewed:     The following studies were reviewed today:   June 02, 2022 ZIO monitor personally reviewed shows 55% burden of atrial fibrillation with average ventricular rate of 94 bpm.  His symptom triggers correspond to atrial fibrillation episodes.     Physical Exam:     VS:  BP 108/69   Pulse 92   Ht 5\' 10"  (1.778 m)   Wt 249 lb (112.9 kg)   SpO2 98%   BMI 35.73 kg/m         Wt Readings from Last 3 Encounters:  06/26/22 249 lb (112.9 kg)  05/01/22 250 lb (113.4 kg)  03/15/22 254 lb (115.2 kg)      GEN:  Well nourished, well developed in no acute distress CARDIAC: RRR, no murmurs, rubs, gallops RESPIRATORY:  Clear to auscultation without rales, wheezing or rhonchi          Assessment ASSESSMENT:     1. Paroxysmal atrial fibrillation  (HCC)   2. Primary hypertension   3. Sinus node dysfunction (HCC)   4. Cardiac pacemaker in situ     PLAN:     In order of problems listed above:     #Paroxysmal atrial fibrillation  55% burden on recent ZIO monitor.   Discussed treatment options today for their AF including antiarrhythmic drug therapy and ablation. Discussed risks, recovery and likelihood of success. Discussed potential need for repeat ablation procedures and antiarrhythmic drugs after an initial ablation. They wish to proceed with scheduling.   Risk, benefits, and alternatives to EP study and radiofrequency ablation for afib were also discussed in detail today. These risks include but are not limited to stroke, bleeding, vascular damage, tamponade, perforation, damage to the esophagus, lungs, and other structures, pulmonary vein stenosis, worsening renal function, and death. The patient understands these risk and wishes to proceed.  We will therefore proceed with catheter ablation at the next available time.  Carto, ICE, anesthesia are requested for the procedure.  Will also obtain CT PV protocol prior to the procedure to exclude LAA thrombus and further evaluate atrial anatomy.   #Hypertension At goal today.  Recommend checking blood pressures 1-2 times per  week at home and recording the values.  Recommend bringing these recordings to the primary care physician.   #Sinus node dysfunction Pacemaker was recently interrogated at [facility] clinic was found to be in good working order.  He would like to transition his remote monitoring to our clinic.  We will arrange for this today.   Presents for PVI today. Procedure reviewed.       Signed, Steffanie Dunn, MD, [facility], [facility] 08/12/2022 Electrophysiology [facility]

## 2022-08-12 NOTE — Anesthesia Preprocedure Evaluation (Addendum)
Anesthesia Evaluation  Patient identified by MRN, date of birth, ID band Patient awake    Reviewed: Allergy & Precautions, NPO status , Patient's Chart, lab work & pertinent test results, reviewed documented beta blocker date and time   Airway Mallampati: III  TM Distance: >3 FB Neck ROM: Full    Dental  (+) Teeth Intact, Dental Advisory Given   Pulmonary sleep apnea , former smoker   Pulmonary exam normal breath sounds clear to auscultation       Cardiovascular hypertension, Pt. on home beta blockers and Pt. on medications Normal cardiovascular exam+ dysrhythmias Atrial Fibrillation + pacemaker  Rhythm:Regular Rate:Normal     Neuro/Psych negative neurological ROS  negative psych ROS   GI/Hepatic negative GI ROS, Neg liver ROS,,,  Endo/Other  diabetes, Type 2, Oral Hypoglycemic Agents  Obesity   Renal/GU negative Renal ROS     Musculoskeletal  (+) Arthritis ,    Abdominal   Peds  Hematology  (+) Blood dyscrasia (Eliquis)   Anesthesia Other Findings   Reproductive/Obstetrics                             Anesthesia Physical Anesthesia Plan  ASA: 3  Anesthesia Plan: General   Post-op Pain Management: Tylenol PO (pre-op)*   Induction: Intravenous  PONV Risk Score and Plan: 2 and Dexamethasone and Ondansetron  Airway Management Planned: Oral ETT  Additional Equipment:   Intra-op Plan:   Post-operative Plan: Extubation in OR  Informed Consent: I have reviewed the patients History and Physical, chart, labs and discussed the procedure including the risks, benefits and alternatives for the proposed anesthesia with the patient or authorized representative who has indicated his/her understanding and acceptance.     Dental advisory given  Plan Discussed with: CRNA  Anesthesia Plan Comments:        Anesthesia Quick Evaluation

## 2022-08-12 NOTE — Discharge Instructions (Signed)

## 2022-08-12 NOTE — Progress Notes (Signed)
Pt ambulated to and from bathroom to void with no signs of oozing from bilateral groin sites  

## 2022-08-12 NOTE — Anesthesia Procedure Notes (Signed)
Procedure Name: Intubation Date/Time: 08/12/2022 1:01 PM  Performed by: Maxine Glenn, CRNAPre-anesthesia Checklist: Patient identified, Emergency Drugs available, Suction available and Patient being monitored Patient Re-evaluated:Patient Re-evaluated prior to induction Oxygen Delivery Method: Circle System Utilized Preoxygenation: Pre-oxygenation with 100% oxygen Induction Type: IV induction Ventilation: Mask ventilation without difficulty Laryngoscope Size: Mac and 4 Grade View: Grade I Tube type: Oral Tube size: 7.5 mm Number of attempts: 1 Airway Equipment and Method: Stylet Placement Confirmation: ETT inserted through vocal cords under direct vision, positive ETCO2 and breath sounds checked- equal and bilateral Secured at: 22 cm Tube secured with: Tape Dental Injury: Teeth and Oropharynx as per pre-operative assessment

## 2022-08-13 ENCOUNTER — Encounter (HOSPITAL_COMMUNITY): Payer: Self-pay | Admitting: Cardiology

## 2022-08-13 MED FILL — Cefazolin Sodium For Inj 2 GM: INTRAMUSCULAR | Qty: 2 | Status: AC

## 2022-08-13 MED FILL — Fentanyl Citrate Preservative Free (PF) Inj 100 MCG/2ML: INTRAMUSCULAR | Qty: 1 | Status: AC

## 2022-08-13 NOTE — Anesthesia Postprocedure Evaluation (Signed)
Anesthesia Post Note  Patient: Darius Brown.  Procedure(s) Performed: ATRIAL FIBRILLATION ABLATION     Patient location during evaluation: PACU Anesthesia Type: General Level of consciousness: awake and alert Pain management: pain level controlled Vital Signs Assessment: post-procedure vital signs reviewed and stable Respiratory status: spontaneous breathing, nonlabored ventilation, respiratory function stable and patient connected to nasal cannula oxygen Cardiovascular status: blood pressure returned to baseline and stable Postop Assessment: no apparent nausea or vomiting Anesthetic complications: no   There were no known notable events for this encounter.  Last Vitals:  Vitals:   08/12/22 1830 08/12/22 1900  BP: 126/81 139/86  Pulse: 91 99  Resp: 16 17  Temp:    SpO2: 94% 94%    Last Pain:  Vitals:   08/12/22 1645  TempSrc:   PainSc: 0-No pain                 Collene Schlichter

## 2022-09-02 DIAGNOSIS — G4733 Obstructive sleep apnea (adult) (pediatric): Secondary | ICD-10-CM | POA: Diagnosis not present

## 2022-09-03 ENCOUNTER — Telehealth: Payer: Self-pay | Admitting: Cardiology

## 2022-09-03 MED ORDER — METOPROLOL TARTRATE 25 MG PO TABS: 25.0000 mg | ORAL_TABLET | Freq: Two times a day (BID) | ORAL | 3 refills | Status: DC

## 2022-09-03 NOTE — Telephone Encounter (Signed)
Hi,  Could you please advise if of to refill medication under Dr. Lalla Brothers has the prescribing provider? Medication is currently listed under historical provider. I called the patient to clarify how the patient is taking his metoprolol. The patient was last seen by Dr. Lalla Brothers on 06/26/22 and under the AVS, it states for the patient to take 12.5mg  twice a day, but under the medication tab under the patient's chart it states for the patient to take 25 mg twice a day. The patient's wife states that the patient is taking 25 mg of metoprolol tartrate twice a day. Thank you so much.

## 2022-09-03 NOTE — Telephone Encounter (Signed)
*  STAT* If patient is at the pharmacy, call can be transferred to refill team.   1. Which medications need to be refilled? (please list name of each medication and dose if known) metoprolol tartrate (LOPRESSOR) 25 MG tablet  2. Which pharmacy/location (including street and city if local pharmacy) is medication to be sent to?  [facility] - [place], [place] - [address] AT Portal to Registered [facility] Sites  3. Do they need a 30 day or 90 day supply? 90  

## 2022-09-03 NOTE — Telephone Encounter (Signed)
Medication has been sent in 

## 2022-09-09 ENCOUNTER — Ambulatory Visit (HOSPITAL_COMMUNITY)
Admission: RE | Admit: 2022-09-09 | Discharge: 2022-09-09 | Disposition: A | Discharge status: Home / Self Care | Payer: Federal, State, Local not specified - PPO | Source: Ambulatory Visit | Attending: Internal Medicine | Admitting: Internal Medicine

## 2022-09-09 ENCOUNTER — Encounter (HOSPITAL_COMMUNITY): Payer: Self-pay | Admitting: Internal Medicine

## 2022-09-09 VITALS — BP 124/68 | HR 88 | Ht 71.0 in | Wt 250.0 lb

## 2022-09-09 DIAGNOSIS — E785 Hyperlipidemia, unspecified: Secondary | ICD-10-CM | POA: Diagnosis not present

## 2022-09-09 DIAGNOSIS — Z950 Presence of cardiac pacemaker: Secondary | ICD-10-CM | POA: Diagnosis not present

## 2022-09-09 DIAGNOSIS — I10 Essential (primary) hypertension: Secondary | ICD-10-CM | POA: Diagnosis not present

## 2022-09-09 DIAGNOSIS — Z7985 Long-term (current) use of injectable non-insulin antidiabetic drugs: Secondary | ICD-10-CM | POA: Diagnosis not present

## 2022-09-09 DIAGNOSIS — I480 Paroxysmal atrial fibrillation: Secondary | ICD-10-CM

## 2022-09-09 DIAGNOSIS — D6869 Other thrombophilia: Secondary | ICD-10-CM | POA: Insufficient documentation

## 2022-09-09 DIAGNOSIS — Z79899 Other long term (current) drug therapy: Secondary | ICD-10-CM | POA: Diagnosis not present

## 2022-09-09 DIAGNOSIS — I495 Sick sinus syndrome: Secondary | ICD-10-CM

## 2022-09-09 NOTE — H&P (View-Only) (Signed)
  Primary Care Physician: Jones, Deanna C, MD Primary Cardiologist: None Primary Electrophysiologist: Dr. Lambert Referring Physician: Dr. Lambert   Darius V Gisler Jr. is a 67 y.o. male with a history of HTN, pacemaker implantation 2023 due to sinus node dysfunction (pauses up to 4 seconds), and paroxysmal atrial fibrillation who presents for consultation in the [facility]. Cardiac monitor worn in 05/2022 showed 55% Afib burden. He is s/p Afib ablation by Dr. Lambert on 08/12/22. Patient is on Eliquis 5 mg BID for a CHADS2VASC score of 2.  On follow up today, he is currently in Afib. S/p Afib ablation by Dr. Lambert on 08/12/22. He does not have cardiac awareness when he is in Afib. No rhythm monitoring device at home; unsure when he went out of rhythm. No chest pain, shortness of breath, or trouble swallowing. Leg sites healed without issue.   He is compliant with anticoagulation and has not missed any doses. He has no bleeding concerns.  Today, patient denies symptoms of palpitations, orthopnea, PND, lower extremity edema, dizziness, presyncope, syncope, snoring, daytime somnolence, bleeding, or neurologic sequela. The patient is tolerating medications without difficulties and is otherwise without complaint today.    he has a BMI of Body mass index is 34.87 kg/m.. Filed Weights   09/09/22 1137  Weight: 113.4 kg    Family History  Problem Relation Age of Onset   COPD Father      Atrial Fibrillation Management history:  Previous antiarrhythmic drugs: None Previous cardioversions:  Previous ablations: 08/12/22 Anticoagulation history: Eliquis 5 mg BID   Past Medical History:  Diagnosis Date   Diabetes mellitus without complication (HCC)    Hyperlipidemia    Hypertension    Past Surgical History:  Procedure Laterality Date   APPENDECTOMY     ATRIAL FIBRILLATION ABLATION N/A 08/12/2022   Procedure: ATRIAL FIBRILLATION ABLATION;  Surgeon:  Lambert, Cameron T, MD;  Location: [facility];  Service: Cardiovascular;  Laterality: N/A;   COLONOSCOPY  2007   repeat in 10 years   COLONOSCOPY WITH PROPOFOL N/A 06/27/2016   Procedure: COLONOSCOPY WITH PROPOFOL;  Surgeon: Darren Wohl, MD;  Location: [facility];  Service: Endoscopy;  Laterality: N/A;  Diabetic - insulin and oral meds   HERNIA REPAIR     x 2   KNEE SURGERY Right    KNEE SURGERY Left    PACEMAKER LEADLESS INSERTION N/A 04/05/2021   Procedure: PACEMAKER LEADLESS INSERTION;  Surgeon: Paraschos, Alexander, MD;  Location: [facility];  Service: Cardiovascular;  Laterality: N/A;   POLYPECTOMY  06/27/2016   Procedure: POLYPECTOMY;  Surgeon: Darren Wohl, MD;  Location: [facility];  Service: Endoscopy;;   TONSILLECTOMY      Current Outpatient Medications  Medication Sig Dispense Refill   acetaminophen (TYLENOL) 650 MG CR tablet Take 650 mg by mouth daily.     atorvastatin (LIPITOR) 20 MG tablet Take 20 mg by mouth daily.     cyanocobalamin (VITAMIN B12) 1000 MCG tablet Take 1,000 mcg by mouth daily.     ELIQUIS 5 MG TABS tablet Take 1 tablet (5 mg total) by mouth 2 (two) times daily. 180 tablet 1   empagliflozin (JARDIANCE) 25 MG TABS tablet Take 25 mg by mouth daily.     furosemide (LASIX) 20 MG tablet Take 20 mg by mouth daily.     lisinopril (ZESTRIL) 10 MG tablet Take 10 mg by mouth daily.     metoprolol tartrate (LOPRESSOR) 25 MG tablet   Take 1 tablet (25 mg total) by mouth 2 (two) times daily. 90 tablet 3   Omega-3 Fatty Acids (FISH OIL) 1200 MG CAPS Take 1,200 mg by mouth in the morning and at bedtime.     ONETOUCH ULTRA test strip      OZEMPIC, 1 MG/DOSE, 4 MG/3ML SOPN Inject 1 mg into the skin every 7 (seven) days.     pantoprazole (PROTONIX) 40 MG tablet Take 1 tablet (40 mg total) by mouth daily. 45 tablet 0   pioglitazone (ACTOS) 15 MG tablet Take 15 mg by mouth daily.     colchicine 0.6 MG tablet Take 1 tablet (0.6 mg total) by mouth  2 (two) times daily for 5 days. (Patient not taking: Reported on 09/09/2022) 10 tablet 0   loratadine (CLARITIN) 10 MG tablet Take 10 mg by mouth daily.     No current facility-administered medications for this encounter.    No Known Allergies  Social History   Socioeconomic History   Marital status: Married    Spouse name: Not on file   Number of children: Not on file   Years of education: Not on file   Highest education level: Not on file  Occupational History   Not on file  Tobacco Use   Smoking status: Former    Types: Cigarettes    Quit date: 04/01/2006    Years since quitting: 16.4   Smokeless tobacco: Never  Vaping Use   Vaping Use: Never used  Substance and Sexual Activity   Alcohol use: Yes    Comment: occ   Drug use: No   Sexual activity: Yes  Other Topics Concern   Not on file  Social History Narrative   Not on file   Social Determinants of Health   Financial Resource Strain: Not on file  Food Insecurity: Not on file  Transportation Needs: Not on file  Physical Activity: Not on file  Stress: Not on file  Social Connections: Not on file  Intimate Partner Violence: Not on file    ROS- All systems are reviewed and negative except as per the HPI above.  Physical Exam: Vitals:   09/09/22 1137  BP: 124/68  Pulse: 88  Weight: 113.4 kg  Height: 5' 11" (1.803 m)    GEN- The patient is a well appearing male, alert and oriented x 3 today.   Head- normocephalic, atraumatic Eyes-  Sclera clear, conjunctiva pink Ears- hearing intact Oropharynx- clear Neck- supple  Lungs- Clear to ausculation bilaterally, normal work of breathing Heart- Irregular rate and rhythm, no murmurs, rubs or gallops  GI- soft, NT, ND, + BS Extremities- no clubbing, cyanosis, or edema MS- no significant deformity or atrophy Skin- no rash or lesion Psych- euthymic mood, full affect Neuro- strength and sensation are intact  Wt Readings from Last 3 Encounters:  09/09/22 113.4 kg   08/12/22 108.9 kg  06/26/22 112.9 kg    EKG today demonstrates  Vent. rate 88 BPM PR interval * ms QRS duration 98 ms QT/QTcB 346/418 ms P-R-T axes * -45 13 Atrial fibrillation Incomplete right bundle branch block Left anterior fascicular block Minimal voltage criteria for LVH, may be normal variant ( R in aVL ) Possible Lateral infarct , age undetermined Abnormal ECG When compared with ECG of 12-Aug-2022 16:16, PREVIOUS ECG IS PRESENT  Echo ([facility]) 04/26/20 demonstrated: INTERPRETATION  NORMAL LEFT VENTRICULAR SYSTOLIC FUNCTION  NORMAL RIGHT VENTRICULAR SYSTOLIC FUNCTION  TRIVIAL REGURGITATION NOTED (See above)  NO VALVULAR STENOSIS  TRIVIAL AR, MR,   TR  EF 55%   Cardiac monitor 05/2022: 55% Afib burden  Epic records are reviewed at length today.  CHA2DS2-VASc Score = 2  The patient's score is based upon: CHF History: 0 HTN History: 1 Diabetes History: 0 Stroke History: 0 Vascular Disease History: 0 Age Score: 1 Gender Score: 0       ASSESSMENT AND PLAN: Paroxysmal Atrial Fibrillation (ICD10:  I48.0) The patient's CHA2DS2-VASc score is 2, indicating a 2.2% annual risk of stroke.   S/p Afib ablation by Dr. Lambert on 08/12/22.  He is in Afib today. We will schedule DCCV. He has not missed any doses of Eliquis. He would prefer to be scheduled in [place] for DCCV and f/u  if possible due to living in [place]. We will try and facilitate this per patient's request. Recommended rhythm monitoring device for them to check at home since wife asked about this.   Continue current medications without change.  Secondary Hypercoagulable State (ICD10:  D68.69) The patient is at significant risk for stroke/thromboembolism based upon his CHA2DS2-VASc Score of 2.  Continue Apixaban (Eliquis).  No missed doses   F/u with EP 1-2 weeks after DCCV.   Frederico Gerling, PA-C [facility] [address] [place], [place]  [address] [phone] 09/09/2022 12:02 PM  

## 2022-09-09 NOTE — Progress Notes (Signed)
Primary Care Physician: Duanne Limerick, MD Primary Cardiologist: None Primary Electrophysiologist: Dr. Lalla Brothers Referring Physician: Dr. Angie Fava. is a 67 y.o. male with a history of HTN, pacemaker implantation 2023 due to sinus node dysfunction (pauses up to 4 seconds), and paroxysmal atrial fibrillation who presents for consultation in the [facility]. Cardiac monitor worn in 05/2022 showed 55% Afib burden. He is s/p Afib ablation by Dr. Lalla Brothers on 08/12/22. Patient is on Eliquis 5 mg BID for a CHADS2VASC score of 2.  On follow up today, he is currently in Afib. S/p Afib ablation by Dr. Lalla Brothers on 08/12/22. He does not have cardiac awareness when he is in Afib. No rhythm monitoring device at home; unsure when he went out of rhythm. No chest pain, shortness of breath, or trouble swallowing. Leg sites healed without issue.   He is compliant with anticoagulation and has not missed any doses. He has no bleeding concerns.  Today, patient denies symptoms of palpitations, orthopnea, PND, lower extremity edema, dizziness, presyncope, syncope, snoring, daytime somnolence, bleeding, or neurologic sequela. The patient is tolerating medications without difficulties and is otherwise without complaint today.    he has a BMI of Body mass index is 34.87 kg/m.Marland Kitchen Filed Weights   09/09/22 1137  Weight: 113.4 kg    Family History  Problem Relation Age of Onset   COPD Father      Atrial Fibrillation Management history:  Previous antiarrhythmic drugs: None Previous cardioversions:  Previous ablations: 08/12/22 Anticoagulation history: Eliquis 5 mg BID   Past Medical History:  Diagnosis Date   Diabetes mellitus without complication (HCC)    Hyperlipidemia    Hypertension    Past Surgical History:  Procedure Laterality Date   APPENDECTOMY     ATRIAL FIBRILLATION ABLATION N/A 08/12/2022   Procedure: ATRIAL FIBRILLATION ABLATION;  Surgeon:  Lanier Prude, MD;  [facility];  Service: Cardiovascular;  Laterality: N/A;   COLONOSCOPY  2007   repeat in 10 years   COLONOSCOPY WITH PROPOFOL N/A 06/27/2016   Procedure: COLONOSCOPY WITH PROPOFOL;  Surgeon: Midge Minium, MD;  Location: [facility];  Service: Endoscopy;  Laterality: N/A;  Diabetic - insulin and oral meds   HERNIA REPAIR     x 2   KNEE SURGERY Right    KNEE SURGERY Left    PACEMAKER LEADLESS INSERTION N/A 04/05/2021   Procedure: PACEMAKER LEADLESS INSERTION;  Surgeon: Marcina Millard, MD;  Location: [facility] INVASIVE CV LAB;  Service: Cardiovascular;  Laterality: N/A;   POLYPECTOMY  06/27/2016   Procedure: POLYPECTOMY;  Surgeon: Midge Minium, MD;  Location: [facility];  Service: Endoscopy;;   TONSILLECTOMY      Current Outpatient Medications  Medication Sig Dispense Refill   acetaminophen (TYLENOL) 650 MG CR tablet Take 650 mg by mouth daily.     atorvastatin (LIPITOR) 20 MG tablet Take 20 mg by mouth daily.     cyanocobalamin (VITAMIN B12) 1000 MCG tablet Take 1,000 mcg by mouth daily.     ELIQUIS 5 MG TABS tablet Take 1 tablet (5 mg total) by mouth 2 (two) times daily. 180 tablet 1   empagliflozin (JARDIANCE) 25 MG TABS tablet Take 25 mg by mouth daily.     furosemide (LASIX) 20 MG tablet Take 20 mg by mouth daily.     lisinopril (ZESTRIL) 10 MG tablet Take 10 mg by mouth daily.     metoprolol tartrate (LOPRESSOR) 25 MG tablet  Take 1 tablet (25 mg total) by mouth 2 (two) times daily. 90 tablet 3   Omega-3 Fatty Acids (FISH OIL) 1200 MG CAPS Take 1,200 mg by mouth in the morning and at bedtime.     ONETOUCH ULTRA test strip      OZEMPIC, 1 MG/DOSE, 4 MG/3ML SOPN Inject 1 mg into the skin every 7 (seven) days.     pantoprazole (PROTONIX) 40 MG tablet Take 1 tablet (40 mg total) by mouth daily. 45 tablet 0   pioglitazone (ACTOS) 15 MG tablet Take 15 mg by mouth daily.     colchicine 0.6 MG tablet Take 1 tablet (0.6 mg total) by mouth  2 (two) times daily for 5 days. (Patient not taking: Reported on 09/09/2022) 10 tablet 0   loratadine (CLARITIN) 10 MG tablet Take 10 mg by mouth daily.     No current facility-administered medications for this encounter.    No Known Allergies  Social History   Socioeconomic History   Marital status: Married    Spouse name: Not on file   Number of children: Not on file   Years of education: Not on file   Highest education level: Not on file  Occupational History   Not on file  Tobacco Use   Smoking status: Former    Types: Cigarettes    Quit date: 04/01/2006    Years since quitting: 16.4   Smokeless tobacco: Never  Vaping Use   Vaping Use: Never used  Substance and Sexual Activity   Alcohol use: Yes    Comment: occ   Drug use: No   Sexual activity: Yes  Other Topics Concern   Not on file  Social History Narrative   Not on file   Social Determinants of Health   Financial Resource Strain: Not on file  Food Insecurity: Not on file  Transportation Needs: Not on file  Physical Activity: Not on file  Stress: Not on file  Social Connections: Not on file  Intimate Partner Violence: Not on file    ROS- All systems are reviewed and negative except as per the HPI above.  Physical Exam: Vitals:   09/09/22 1137  BP: 124/68  Pulse: 88  Weight: 113.4 kg  Height: 5\' 11"  (1.803 m)    GEN- The patient is a well appearing male, alert and oriented x 3 today.   Head- normocephalic, atraumatic Eyes-  Sclera clear, conjunctiva pink Ears- hearing intact Oropharynx- clear Neck- supple  Lungs- Clear to ausculation bilaterally, normal work of breathing Heart- Irregular rate and rhythm, no murmurs, rubs or gallops  GI- soft, NT, ND, + BS Extremities- no clubbing, cyanosis, or edema MS- no significant deformity or atrophy Skin- no rash or lesion Psych- euthymic mood, full affect Neuro- strength and sensation are intact  Wt Readings from Last 3 Encounters:  09/09/22 113.4 kg   08/12/22 108.9 kg  06/26/22 112.9 kg    EKG today demonstrates  Vent. rate 88 BPM PR interval * ms QRS duration 98 ms QT/QTcB 346/418 ms P-R-T axes * -45 13 Atrial fibrillation Incomplete right bundle branch block Left anterior fascicular block Minimal voltage criteria for LVH, may be normal variant ( R in aVL ) Possible Lateral infarct , age undetermined Abnormal ECG When compared with ECG of 12-Aug-2022 16:16, PREVIOUS ECG IS PRESENT  Echo ([facility]) 04/26/20 demonstrated: INTERPRETATION  NORMAL LEFT VENTRICULAR SYSTOLIC FUNCTION  NORMAL RIGHT VENTRICULAR SYSTOLIC FUNCTION  TRIVIAL REGURGITATION NOTED (See above)  NO VALVULAR STENOSIS  TRIVIAL AR, MR,  TR  EF 55%   Cardiac monitor 05/2022: 55% Afib burden  Epic records are reviewed at length today.  CHA2DS2-VASc Score = 2  The patient's score is based upon: CHF History: 0 HTN History: 1 Diabetes History: 0 Stroke History: 0 Vascular Disease History: 0 Age Score: 1 Gender Score: 0       ASSESSMENT AND PLAN: Paroxysmal Atrial Fibrillation (ICD10:  I48.0) The patient's CHA2DS2-VASc score is 2, indicating a 2.2% annual risk of stroke.   S/p Afib ablation by Dr. Lalla Brothers on 08/12/22.  He is in Afib today. We will schedule DCCV. He has not missed any doses of Eliquis. He would prefer to be scheduled in [facility] for DCCV and f/u  if possible due to living in [place]. We will try and facilitate this per patient's request. Recommended rhythm monitoring device for them to check at home since wife asked about this.   Continue current medications without change.  Secondary Hypercoagulable State (ICD10:  D68.69) The patient is at significant risk for stroke/thromboembolism based upon his CHA2DS2-VASc Score of 2.  Continue Apixaban (Eliquis).  No missed doses   F/u with EP 1-2 weeks after DCCV.   L[place] Bells, PA-C Afib Clinic [facility] [address] [place], [place]  [address] [phone] 09/09/2022 12:02 PM

## 2022-09-10 ENCOUNTER — Telehealth: Payer: Self-pay

## 2022-09-10 NOTE — Telephone Encounter (Signed)
-----   Message from Shona Simpson, RN sent at 09/09/2022 12:11 PM EDT ----- Regarding: transmission Pt to send transmission when he gets home today to see if he is staying persistently in afib  thanks stacy

## 2022-09-10 NOTE — Telephone Encounter (Signed)
Attempted to call patient to send manual transmission. No answer, LMTCB. ?

## 2022-09-11 NOTE — Telephone Encounter (Signed)
Attempted to call patient. No answer, left detailed message on VM requesting a manual remote transmission.

## 2022-09-12 ENCOUNTER — Ambulatory Visit (INDEPENDENT_AMBULATORY_CARE_PROVIDER_SITE_OTHER): Payer: Federal, State, Local not specified - PPO

## 2022-09-12 ENCOUNTER — Telehealth: Payer: Self-pay | Admitting: Cardiology

## 2022-09-12 DIAGNOSIS — I495 Sick sinus syndrome: Secondary | ICD-10-CM

## 2022-09-12 NOTE — Telephone Encounter (Signed)
Spoke to the patient, advised pt Dr. Lalla Brothers and his nurse are currently not in the office. Pt had OV with Afib clinic and was advised he will need DCCV Advised pt  the device clinic made several attempts to contact the patient to send remote transmission. Pt wife stated he purchased a new cell phone and if we can not contact him please call her @ [phone]. Pt stated he called [employer] and was advised they will send the office pt transmission from the past 30 days. Will forward to MD and nurse.

## 2022-09-12 NOTE — Telephone Encounter (Signed)
We have made all attempts to contact patient with no success. Wanted to update you.  Thanks, Marliss Czar

## 2022-09-12 NOTE — Telephone Encounter (Signed)
Patient is calling to talk with Dr. Lalla Brothers or nurse in regards to appt with AFIB clinic on Monday. Please call back to discuss

## 2022-09-13 ENCOUNTER — Other Ambulatory Visit (HOSPITAL_COMMUNITY): Payer: Self-pay | Admitting: *Deleted

## 2022-09-13 ENCOUNTER — Encounter (HOSPITAL_COMMUNITY): Payer: Self-pay

## 2022-09-13 DIAGNOSIS — I4819 Other persistent atrial fibrillation: Secondary | ICD-10-CM

## 2022-09-13 DIAGNOSIS — I495 Sick sinus syndrome: Secondary | ICD-10-CM

## 2022-09-13 LAB — CUP PACEART REMOTE DEVICE CHECK
Battery Remaining Longevity: 96 mo
Battery Voltage: 3.04 V
Brady Statistic AS VP Percent: 0 %
Brady Statistic AS VS Percent: 0 %
Brady Statistic RV Percent Paced: 0.39 %
Date Time Interrogation Session: 20240613145900
Implantable Pulse Generator Implant Date: 20230105
Lead Channel Impedance Value: 540 Ohm
Lead Channel Sensing Intrinsic Amplitude: 9.675 mV
Lead Channel Setting Pacing Amplitude: 2 V
Lead Channel Setting Pacing Pulse Width: 0.24 ms
Lead Channel Setting Sensing Sensitivity: 2 mV

## 2022-09-13 NOTE — Telephone Encounter (Signed)
Cardioversion scheduled

## 2022-09-13 NOTE — Telephone Encounter (Signed)
Pt returned nurse call. He would like for her to give him a call back at [phone].

## 2022-09-19 ENCOUNTER — Other Ambulatory Visit
Admission: RE | Admit: 2022-09-19 | Discharge: 2022-09-19 | Disposition: A | Discharge status: Home / Self Care | Payer: Federal, State, Local not specified - PPO | Attending: Internal Medicine | Admitting: Internal Medicine

## 2022-09-19 DIAGNOSIS — I4819 Other persistent atrial fibrillation: Secondary | ICD-10-CM | POA: Diagnosis not present

## 2022-09-19 LAB — BASIC METABOLIC PANEL
Anion gap: 11 (ref 5–15)
BUN: 27 mg/dL — ABNORMAL HIGH (ref 8–23)
CO2: 23 mmol/L (ref 22–32)
Calcium: 8.9 mg/dL (ref 8.9–10.3)
Chloride: 105 mmol/L (ref 98–111)
Creatinine, Ser: 1.14 mg/dL (ref 0.61–1.24)
GFR, Estimated: >60 mL/min (ref >60)
Glucose, Bld: 168 mg/dL — ABNORMAL HIGH (ref 70–99)
Potassium: 4.6 mmol/L (ref 3.5–5.1)
Sodium: 139 mmol/L (ref 135–145)

## 2022-09-19 LAB — CBC
HCT: 46.6 % (ref 39.0–52.0)
Hemoglobin: 15.1 g/dL (ref 13.0–17.0)
MCH: 32.5 pg (ref 26.0–34.0)
MCHC: 32.4 g/dL (ref 30.0–36.0)
MCV: 100.4 fL — ABNORMAL HIGH (ref 80.0–100.0)
Platelets: 272 10*3/uL (ref 150–400)
RBC: 4.64 MIL/uL (ref 4.22–5.81)
RDW: 12.6 % (ref 11.5–15.5)
WBC: 6.8 10*3/uL (ref 4.0–10.5)
nRBC: 0 % (ref 0.0–0.2)

## 2022-09-20 DIAGNOSIS — E1159 Type 2 diabetes mellitus with other circulatory complications: Secondary | ICD-10-CM | POA: Diagnosis not present

## 2022-09-20 DIAGNOSIS — E1169 Type 2 diabetes mellitus with other specified complication: Secondary | ICD-10-CM | POA: Diagnosis not present

## 2022-09-20 DIAGNOSIS — I152 Hypertension secondary to endocrine disorders: Secondary | ICD-10-CM | POA: Diagnosis not present

## 2022-09-20 DIAGNOSIS — E119 Type 2 diabetes mellitus without complications: Secondary | ICD-10-CM | POA: Diagnosis not present

## 2022-09-25 ENCOUNTER — Ambulatory Visit: Payer: Medicare Other

## 2022-09-26 ENCOUNTER — Ambulatory Visit: Payer: Federal, State, Local not specified - PPO | Admitting: Certified Registered Nurse Anesthetist

## 2022-09-26 ENCOUNTER — Ambulatory Visit
Admission: RE | Admit: 2022-09-26 | Discharge: 2022-09-26 | Disposition: A | Discharge status: Home / Self Care | Payer: Federal, State, Local not specified - PPO | Attending: Cardiovascular Disease | Admitting: Cardiovascular Disease

## 2022-09-26 ENCOUNTER — Telehealth: Payer: Self-pay | Admitting: Cardiology

## 2022-09-26 ENCOUNTER — Encounter: Payer: Self-pay | Admitting: Cardiovascular Disease

## 2022-09-26 ENCOUNTER — Encounter
Admission: RE | Disposition: A | Discharge status: Home / Self Care | Payer: Self-pay | Source: Home / Self Care | Attending: Cardiovascular Disease

## 2022-09-26 DIAGNOSIS — D6869 Other thrombophilia: Secondary | ICD-10-CM | POA: Insufficient documentation

## 2022-09-26 DIAGNOSIS — Z87891 Personal history of nicotine dependence: Secondary | ICD-10-CM | POA: Diagnosis not present

## 2022-09-26 DIAGNOSIS — I480 Paroxysmal atrial fibrillation: Secondary | ICD-10-CM | POA: Insufficient documentation

## 2022-09-26 DIAGNOSIS — Z7901 Long term (current) use of anticoagulants: Secondary | ICD-10-CM | POA: Diagnosis not present

## 2022-09-26 DIAGNOSIS — Z538 Procedure and treatment not carried out for other reasons: Secondary | ICD-10-CM | POA: Insufficient documentation

## 2022-09-26 DIAGNOSIS — I4891 Unspecified atrial fibrillation: Secondary | ICD-10-CM | POA: Diagnosis not present

## 2022-09-26 DIAGNOSIS — I10 Essential (primary) hypertension: Secondary | ICD-10-CM | POA: Diagnosis not present

## 2022-09-26 HISTORY — PX: CARDIOVERSION: SHX1299

## 2022-09-26 HISTORY — DX: Cardiac arrhythmia, unspecified: I49.9

## 2022-09-26 SURGERY — CARDIOVERSION
Anesthesia: General

## 2022-09-26 MED ORDER — PROPOFOL 10 MG/ML IV BOLUS
INTRAVENOUS | Status: AC
  Filled 2022-09-26: qty 20

## 2022-09-26 NOTE — Telephone Encounter (Signed)
Patient wife is asking, while he listen is.   She ask regarding appt August 14 with Dr Lalla Brothers. It shows he has appt. In 2 weeks but they are changing his appt. With Dr Lalla Brothers. She is wanting to know why this is changing.  He had cardioversion this morning but was not in Afib so no cardioversion done today. He had an ablasion in May.  Stated Dr Lalla Brothers was not sure if it"took" and we were aware.  Went to see PA and found he was still in Afib which is how Cardioversion was scheduled.They are concerned that this wasn't  able to be done.  She also ask regarding sleep study and pulmonology.  She has multiple questions and ask regarding Lasix as well and edema. He needs an appointment for multiple questions.

## 2022-09-26 NOTE — Telephone Encounter (Signed)
Pt is requesting a call back regarding some questions and concerns he and his wife has regarding appts, his recent surgery and medications. Please advise

## 2022-09-26 NOTE — Interval H&P Note (Signed)
History and Physical Interval Note:  09/26/2022 7:04 AM  Darius Brown.  has presented today for surgery, with the diagnosis of Cardioversion  Afib.  The patient was noted to be in sinus rhythm and thus the procedure was canceled.   Lorine Bears

## 2022-09-26 NOTE — Progress Notes (Signed)
Patient is in NSR. Dr. Arnell Sieving for patient to be discharged to home. Will f/u with [facility].

## 2022-09-27 NOTE — Telephone Encounter (Signed)
This patients pacemaker does not have fluid diagnostics available.

## 2022-10-01 NOTE — Progress Notes (Signed)
Remote pacemaker transmission.   

## 2022-10-02 DIAGNOSIS — G4733 Obstructive sleep apnea (adult) (pediatric): Secondary | ICD-10-CM | POA: Diagnosis not present

## 2022-10-08 ENCOUNTER — Other Ambulatory Visit: Payer: Medicare Other

## 2022-10-09 DIAGNOSIS — Z950 Presence of cardiac pacemaker: Secondary | ICD-10-CM | POA: Insufficient documentation

## 2022-10-09 NOTE — Progress Notes (Unsigned)
Cardiology Office Note Date:  10/10/2022  Patient ID:  Darius Brown., DOB July 23, 1955, MRN [id number] PCP:  Duanne Limerick, MD  Cardiologist:  None Electrophysiologist: Lanier Prude, MD    Chief Complaint: afib  History of Present Illness: Darius Brown. is a 67 y.o. male with PMH notable for SND s/p PPM, parox AFib, HTN; seen today for Lanier Prude, MD for acute visit due to canceled cardioversion due to patient being in normal sinus rhythm.   He is s/p AF ablation 07/2022 by Dr. Lalla Brothers He was seen in A-fib clinic for routine post ablation appointment and was noted to be in A-fib.  No cardiac awareness.  He was set up for cardioversion 6/27, when presented for procedure he was found to be in sinus rhythm.  When the cardioversion was not performed the patient's wife called clinic with multiple questions that were not able to be answered over the phone.   On follow-up today, patient continues to have paroxysms of A-fib/flutter.  He continues to not have any cardiac awareness. He has an Scientist, physiological that confirms paroxysms of A-fib. He diligently takes Eliquis twice daily, no missed doses.  No bleeding concerns. He does have some slight increased edema.  He states that pulmonary had started him on 20 mg Lasix daily for 90 days.  This prescription has ended and has not been refilled by  pulmonology as he does not see them regularly.   he denies chest pain, palpitations, dyspnea, PND, orthopnea, nausea, vomiting, dizziness, syncope, weight gain, or early satiety.     Device Information: MDT Micra, IMP 04/2021 Imp by Dr. Evette Georges, [facility]  AAD History: Amiodarone, stopped due to lack of A-fib  Past Medical History:  Diagnosis Date   Diabetes mellitus without complication (HCC)    Dysrhythmia    Hyperlipidemia    Hypertension     Past Surgical History:  Procedure Laterality Date   APPENDECTOMY     ATRIAL FIBRILLATION ABLATION N/A 08/12/2022   Procedure: ATRIAL  FIBRILLATION ABLATION;  Surgeon: Lanier Prude, MD;  [facility]: [facility];  Service: Cardiovascular;  Laterality: N/A;   CARDIOVERSION N/A 09/26/2022   Procedure: CARDIOVERSION;  Surgeon: Iran Ouch, MD;  Location: [facility] ORS;  Service: Cardiovascular;  Laterality: N/A;   COLONOSCOPY  2007   repeat in 10 years   COLONOSCOPY WITH PROPOFOL N/A 06/27/2016   Procedure: COLONOSCOPY WITH PROPOFOL;  Surgeon: Midge Minium, MD;  Location: [facility];  Service: Endoscopy;  Laterality: N/A;  Diabetic - insulin and oral meds   HERNIA REPAIR     x 2   KNEE SURGERY Right    KNEE SURGERY Left    PACEMAKER LEADLESS INSERTION N/A 04/05/2021   Procedure: PACEMAKER LEADLESS INSERTION;  Surgeon: Marcina Millard, MD;  Location: [facility] INVASIVE CV LAB;  Service: Cardiovascular;  Laterality: N/A;   POLYPECTOMY  06/27/2016   Procedure: POLYPECTOMY;  Surgeon: Midge Minium, MD;  Location: [facility];  Service: Endoscopy;;   TONSILLECTOMY      Current Outpatient Medications  Medication Instructions   acetaminophen (TYLENOL) 650 mg, Oral, Daily   atorvastatin (LIPITOR) 20 mg, Oral, Daily   cetirizine (ZYRTEC) 10 mg, Oral, Daily   cyanocobalamin (VITAMIN B12) 1,000 mcg, Oral, Daily   Eliquis 5 mg, Oral, 2 times daily   empagliflozin (JARDIANCE) 25 mg, Oral, Daily   Fish Oil 1,200 mg, Oral, 2 times daily   flecainide (TAMBOCOR) 50 mg, Oral, 2 times daily  furosemide (LASIX) 20 mg, Oral, Daily   lisinopril (ZESTRIL) 10 mg, Oral, Daily   metoprolol tartrate (LOPRESSOR) 25 mg, Oral, 2 times daily   ONETOUCH ULTRA test strip No dose, route, or frequency recorded.   Ozempic (1 MG/DOSE) 2 mg, Subcutaneous, Every 7 days   pioglitazone (ACTOS) 15 mg, Oral, Daily    Social History:  The patient  reports that he quit smoking about 16 years ago. His smoking use included cigarettes. He has never used smokeless tobacco. He reports that he does not currently use alcohol. He reports that he  does not use drugs.   Family History:   The patient's family history includes COPD in his father.  ROS:  Please see the history of present illness. All other systems are reviewed and otherwise negative.   PHYSICAL EXAM:  VS:  BP 94/70 (BP Location: Left Arm, Patient Position: Sitting, Cuff Size: Large)   Pulse (!) 102   Ht 5\' 11"  (1.803 m)   Wt 252 lb (114.3 kg)   SpO2 97%   BMI 35.15 kg/m  BMI: Body mass index is 35.15 kg/m.  GEN- The patient is well appearing, alert and oriented x 3 today.   Lungs- Clear to ausculation bilaterally, normal work of breathing.  Heart- Irregularly irregular rate and rhythm, no murmurs, rubs or gallops Extremities- Trace peripheral edema, warm, dry Skin -leadless pacemaker maker insertion site well-healed  Device interrogation done today and reviewed by myself:  Battery >9 years Lead threshold, impedence, sensing stable  Rare V pacing Histograms elevated No changes made today  EKG is ordered. Personal review of EKG from today shows:   EKG Interpretation Date/Time:  Thursday October 10 2022 11:07:12 EDT Ventricular Rate:  102 PR Interval:    QRS Duration:  94 QT Interval:  334 QTC Calculation: 435 R Axis:   -44  Text Interpretation: Atrial fibrillation with rapid ventricular response Left axis deviation Confirmed by [name] [phone]) on 10/10/2022 11:34:13 AM    Recent Labs: 09/19/2022: BUN 27; Creatinine, Ser 1.14; Hemoglobin 15.1; Platelets 272; Potassium 4.6; Sodium 139  No results found for requested labs within last 365 days.   CrCl cannot be calculated (Patient's most recent lab result is older than the maximum 21 days allowed.).   Wt Readings from Last 3 Encounters:  10/10/22 252 lb (114.3 kg)  09/09/22 250 lb (113.4 kg)  08/12/22 240 lb (108.9 kg)     Additional studies reviewed include: Previous EP, cardiology notes.   TTE, 04/26/2020 NORMAL LEFT VENTRICULAR SYSTOLIC FUNCTION  NORMAL RIGHT VENTRICULAR SYSTOLIC FUNCTION   TRIVIAL REGURGITATION NOTED (See above)  NO VALVULAR STENOSIS  TRIVIAL AR, MR, TR  EF 55%   ASSESSMENT AND PLAN:  #) parox AFib He is status post ablation 07/2022 with Dr. Angela Adam to have paroxysms of A-fib since ablation He presented for recent cardioversion and was found to be in normal sinus rhythm Discussed AAD options with patient, will initiate flecainide 50 mg twice daily ETT in 7 to 10 days Discussed with Dr. Lalla Brothers, who recommended repeat ablation at next available  #) Hypercoag due to A-fib CHA2DS2-VASc Score = 3 [CHF History: 0, HTN History: 1, Diabetes History: 1, Stroke History: 0, Vascular Disease History: 0, Age Score: 1, Gender Score: 0].  Therefore, the patient's annual risk of stroke is 3.2 %. OAC 5 mg Eliquis twice daily, appropriately dosed No bleeding concerns #) SND s/p MDT Micra leadless PPM Rare V pacing Device functioning well, see Paceart for details  #)  Lower extremity edema Warm on exam with increased lower extremity edema Pulm provider had started patient on 20 mg Lasix, I have recommended that he continue this.  Will provide new refill Will update echo given that it has been many years and swelling in the relatively new complaint  #) HTN At goal today.  Recommend checking blood pressures 1-2 times per week at home and recording the values.  Recommend bringing these recordings to the primary care physician.   Informed Consent   Shared Decision Making/Informed Consent The risks [chest pain, shortness of breath, cardiac arrhythmias, dizziness, blood pressure fluctuations, myocardial infarction, stroke/transient ischemic attack, and life-threatening complications (estimated to be 1 in 10,000)], benefits (risk stratification, diagnosing coronary artery disease, treatment guidance) and alternatives of an exercise tolerance test were discussed in detail with Mr. Vandevoorde and he agrees to proceed.      Current medicines are reviewed at length  with the patient today.   The patient does not have concerns regarding his medicines.  The following changes were made today:   Start flecainide 50 mg twice daily Start 20 mg Lasix daily  Labs/ tests ordered today include:  Orders Placed This Encounter  Procedures   B Nat Peptide   Basic Metabolic Panel (BMET)   Magnesium   Exercise Tolerance Test   EKG 12-Lead   ECHOCARDIOGRAM COMPLETE     Disposition: Follow up with EP APP as usual post procedure Follow-up with Dr. Lalla Brothers 12/2022 for routine 73-month post ablation appointment   Signed, [name], NP  10/10/22  1:09 PM  Electrophysiology [facility]

## 2022-10-10 ENCOUNTER — Ambulatory Visit: Payer: Federal, State, Local not specified - PPO | Attending: Cardiology | Admitting: Cardiology

## 2022-10-10 ENCOUNTER — Other Ambulatory Visit
Admission: RE | Admit: 2022-10-10 | Discharge: 2022-10-10 | Disposition: A | Discharge status: Home / Self Care | Payer: Federal, State, Local not specified - PPO | Source: Ambulatory Visit | Attending: Cardiology | Admitting: Cardiology

## 2022-10-10 ENCOUNTER — Encounter: Payer: Self-pay | Admitting: Cardiology

## 2022-10-10 ENCOUNTER — Other Ambulatory Visit: Payer: Self-pay

## 2022-10-10 VITALS — BP 94/70 | HR 102 | Ht 71.0 in | Wt 252.0 lb

## 2022-10-10 DIAGNOSIS — I495 Sick sinus syndrome: Secondary | ICD-10-CM | POA: Diagnosis not present

## 2022-10-10 DIAGNOSIS — I10 Essential (primary) hypertension: Secondary | ICD-10-CM | POA: Insufficient documentation

## 2022-10-10 DIAGNOSIS — Z79899 Other long term (current) drug therapy: Secondary | ICD-10-CM | POA: Diagnosis not present

## 2022-10-10 DIAGNOSIS — I480 Paroxysmal atrial fibrillation: Secondary | ICD-10-CM | POA: Insufficient documentation

## 2022-10-10 DIAGNOSIS — D6869 Other thrombophilia: Secondary | ICD-10-CM | POA: Diagnosis not present

## 2022-10-10 DIAGNOSIS — R600 Localized edema: Secondary | ICD-10-CM | POA: Diagnosis not present

## 2022-10-10 DIAGNOSIS — Z950 Presence of cardiac pacemaker: Secondary | ICD-10-CM | POA: Diagnosis not present

## 2022-10-10 LAB — BASIC METABOLIC PANEL
Anion gap: 8 (ref 5–15)
BUN: 23 mg/dL (ref 8–23)
CO2: 24 mmol/L (ref 22–32)
Calcium: 9.4 mg/dL (ref 8.9–10.3)
Chloride: 108 mmol/L (ref 98–111)
Creatinine, Ser: 1.01 mg/dL (ref 0.61–1.24)
GFR, Estimated: >60 mL/min (ref >60)
Glucose, Bld: 134 mg/dL — ABNORMAL HIGH (ref 70–99)
Potassium: 4.9 mmol/L (ref 3.5–5.1)
Sodium: 140 mmol/L (ref 135–145)

## 2022-10-10 LAB — MAGNESIUM: Magnesium: 2.3 mg/dL (ref 1.7–2.4)

## 2022-10-10 LAB — CUP PACEART INCLINIC DEVICE CHECK
Date Time Interrogation Session: 20240711170210
Implantable Pulse Generator Implant Date: 20230105

## 2022-10-10 LAB — BRAIN NATRIURETIC PEPTIDE: B Natriuretic Peptide: 25.5 pg/mL (ref 0.0–100.0)

## 2022-10-10 MED ORDER — FUROSEMIDE 20 MG PO TABS: 20.0000 mg | ORAL_TABLET | Freq: Every day | ORAL | 0 refills | Status: DC

## 2022-10-10 MED ORDER — FLECAINIDE ACETATE 50 MG PO TABS: 50.0000 mg | ORAL_TABLET | Freq: Two times a day (BID) | ORAL | 0 refills | Status: DC

## 2022-10-10 NOTE — Patient Instructions (Addendum)
Medication Instructions:  RESTART furosemide 20 mg by mouth daily START flecainide 50 mg BID  *If you need a refill on your cardiac medications before your next appointment, please call your pharmacy*  Lab Work: BMP, BNP, and magnesium level - Please go to the [facility]. You will check in at the front desk to the right as you walk into the atrium. Valet Parking is offered if needed. - No appointment needed. You may go any day between 7 am and 6 pm.  If you have labs (blood work) drawn today and your tests are completely normal, you will receive your results only by: MyChart Message (if you have MyChart) OR A paper copy in the mail If you have any lab test that is abnormal or we need to change your treatment, we will call you to review the results.   Testing/Procedures: Your physician has requested that you have an echocardiogram. Echocardiography is a painless test that uses sound waves to create images of your heart. It provides your doctor with information about the size and shape of your heart and how well your heart's chambers and valves are working. This procedure takes approximately one hour. There are no restrictions for this procedure. Please do NOT wear cologne, perfume, aftershave, or lotions (deodorant is allowed). Please arrive 15 minutes prior to your appointment time.  Your physician has also ordered an exercise stress test. - you may eat a light breakfast/ lunch prior to your procedure - no caffeine for 24 hours prior to your test (coffee, tea, soft drinks, or chocolate)  - no smoking/ vaping for 4 hours prior to your test - you may take your regular medications the day of your test  - bring any inhalers with you to your test - wear comfortable clothing & tennis/ non-skid shoes to walk on the treadmill  Follow-Up: At [facility], you and your health needs are our priority.  As part of our continuing mission to provide you with exceptional heart care, we  have created designated Provider Care Teams.  These Care Teams include your primary Cardiologist (physician) and Advanced Practice Providers (APPs -  Physician Assistants and Nurse Practitioners) who all work together to provide you with the care you need, when you need it.  We recommend signing up for the patient portal called "MyChart".  Sign up information is provided on this After Visit Summary.  MyChart is used to connect with patients for Virtual Visits (Telemedicine).  Patients are able to view lab/test results, encounter notes, upcoming appointments, etc.  Non-urgent messages can be sent to your provider as well.   To learn more about what you can do with MyChart, go to [email or web address].    Your next appointment:   1 month(s)  Provider:   [name], NP   Other Instructions Please reschedule an appointment with Dr. Lalla Brothers for September 2024.

## 2022-10-11 ENCOUNTER — Telehealth: Payer: Self-pay

## 2022-10-11 DIAGNOSIS — I480 Paroxysmal atrial fibrillation: Secondary | ICD-10-CM

## 2022-10-11 NOTE — Telephone Encounter (Signed)
Per [name], NP, I have scheduled pt for an Afib Ablation with Dr. Lalla Brothers on 03/17/23 at 7:30 AM.  He will have labs done at [facility].  He is scheduled to see Dr. Lalla Brothers on 12/18/22.Darius KitchenMarland Brown

## 2022-10-30 ENCOUNTER — Other Ambulatory Visit: Payer: Self-pay | Admitting: Cardiology

## 2022-10-30 DIAGNOSIS — R600 Localized edema: Secondary | ICD-10-CM

## 2022-10-30 DIAGNOSIS — D6869 Other thrombophilia: Secondary | ICD-10-CM

## 2022-10-30 DIAGNOSIS — Z79899 Other long term (current) drug therapy: Secondary | ICD-10-CM

## 2022-10-30 DIAGNOSIS — I480 Paroxysmal atrial fibrillation: Secondary | ICD-10-CM

## 2022-10-30 DIAGNOSIS — I495 Sick sinus syndrome: Secondary | ICD-10-CM

## 2022-10-30 DIAGNOSIS — I10 Essential (primary) hypertension: Secondary | ICD-10-CM

## 2022-10-30 DIAGNOSIS — Z950 Presence of cardiac pacemaker: Secondary | ICD-10-CM

## 2022-10-31 NOTE — Telephone Encounter (Signed)
Prescription refill request for Eliquis received. Indication: Afib  Last office visit: 10/10/22 ([place])  Scr: 1.01 (10/10/22)  Age: 67 Weight: 114.3kg  Appropriate dose. Refill sent.

## 2022-11-01 ENCOUNTER — Ambulatory Visit
Admission: RE | Admit: 2022-11-01 | Discharge: 2022-11-01 | Disposition: A | Discharge status: Home / Self Care | Payer: Federal, State, Local not specified - PPO | Source: Ambulatory Visit | Attending: Cardiology | Admitting: Cardiology

## 2022-11-01 ENCOUNTER — Telehealth: Payer: Self-pay | Admitting: Cardiology

## 2022-11-01 DIAGNOSIS — Z79899 Other long term (current) drug therapy: Secondary | ICD-10-CM

## 2022-11-01 DIAGNOSIS — I480 Paroxysmal atrial fibrillation: Secondary | ICD-10-CM

## 2022-11-01 LAB — EXERCISE TOLERANCE TEST
Angina Index: 0
Estimated workload: 5.5
Exercise duration (min): 3 min
Exercise duration (sec): 50 s
MPHR: 153 {beats}/min
Peak HR: 91 {beats}/min
Percent HR: 59 %
RPE: 12
Rest HR: 54 {beats}/min

## 2022-11-01 NOTE — Telephone Encounter (Signed)
I called patient to discuss today's ETT, where he had hypotension, dizziness, clamminess with diaphoresis. ST depression in V1, V2, V3  Patient is to stop flecainide immediately. He indicated to NP Hammock today during ETT that he had not started flecainide, but on phone call he indicated that he had started the medication. He will remove from pill box.   For patient's hypotension, he was borderline hypotensive at the beginning of the test and during our appointment 2 weeks ago. Will hold lasix and lisinopril at this time. He is scheduled for an echo 8/8, and follow-up OV 8/12.

## 2022-11-02 DIAGNOSIS — G4733 Obstructive sleep apnea (adult) (pediatric): Secondary | ICD-10-CM | POA: Diagnosis not present

## 2022-11-05 NOTE — H&P (View-Only) (Signed)
 Cardiology Office Note Date:  11/05/2022  Patient ID:  Darius Merrihew., DOB Oct 21, 1955, MRN [id number] PCP:  Duanne Limerick, MD  Cardiologist:  None Electrophysiologist: Lanier Prude, MD    Chief Complaint: afib  History of Present Illness: Darius Brown. is a 67 y.o. male with PMH notable for SND s/p PPM, parox AFib, HTN; seen today for Lanier Prude, MD for acute visit due to failed exercise treadmill test.   He is s/p AF ablation 07/2022 by Dr. Lalla Brothers He was seen in A-fib clinic for routine post ablation appointment and was noted to be in A-fib.  No cardiac awareness.  He was set up for cardioversion 6/27, when presented for procedure he was found to be in sinus rhythm.   I saw him in follow-up 7/11 where he continued to have paroxysms of afib, discussed AAD and redo ablation. (Tentatively scheduled 03/17/23) Decided on flecainide and he presented for ETT 8/2. During test, he developed ?ST depression, hypotension during exercise with fatigue, dyspnea, dizziness. It was unclear whether the patient had initiated flecainide.   On follow-up today, patient has noticed increased fatigue over the last few weeks. He is unsure if it is because he actually is more fatigued or just paying more attention to his exercise level. He continues to have paroxysms of afib, does not think the overall burden has increased. He stopped his lisinopril and lasix as recommended during our previous phone call. Brings a BP log, all systolics 100-120. He does not think he has any lower extremity edema today. Continues to have good appetite.   Diligently takes eliquis BID, no missed doses. No bleeding concerns.   he denies chest pain, palpitations, dyspnea, PND, orthopnea, nausea, vomiting, dizziness, syncope, weight gain, or early satiety.     Device Information: MDT Micra, IMP 04/2021 Imp by Dr. Evette Georges, [facility]  AAD History: Amiodarone, stopped due to lack of ?A-fib Flecainide - briefly  started, stopped d/t failed ETT with concern for significant CAD  Past Medical History:  Diagnosis Date   Diabetes mellitus without complication (HCC)    Dysrhythmia    Hyperlipidemia    Hypertension     Past Surgical History:  Procedure Laterality Date   APPENDECTOMY     ATRIAL FIBRILLATION ABLATION N/A 08/12/2022   Procedure: ATRIAL FIBRILLATION ABLATION;  Surgeon: Lanier Prude, MD;  [facility];  Service: Cardiovascular;  Laterality: N/A;   CARDIOVERSION N/A 09/26/2022   Procedure: CARDIOVERSION;  Surgeon: Iran Ouch, MD;  Location: [facility];  Service: Cardiovascular;  Laterality: N/A;   COLONOSCOPY  2007   repeat in 10 years   COLONOSCOPY WITH PROPOFOL N/A 06/27/2016   Procedure: COLONOSCOPY WITH PROPOFOL;  Surgeon: Midge Minium, MD;  Location: [facility];  Service: Endoscopy;  Laterality: N/A;  Diabetic - insulin and oral meds   HERNIA REPAIR     x 2   KNEE SURGERY Right    KNEE SURGERY Left    PACEMAKER LEADLESS INSERTION N/A 04/05/2021   Procedure: PACEMAKER LEADLESS INSERTION;  Surgeon: Marcina Millard, MD;  Location: [facility];  Service: Cardiovascular;  Laterality: N/A;   POLYPECTOMY  06/27/2016   Procedure: POLYPECTOMY;  Surgeon: Midge Minium, MD;  Location: [facility];  Service: Endoscopy;;   TONSILLECTOMY      Current Outpatient Medications  Medication Instructions   acetaminophen (TYLENOL) 650 mg, Oral, Daily   atorvastatin (LIPITOR) 20 mg, Oral, Daily   cetirizine (ZYRTEC) 10 mg,  Oral, Daily   cyanocobalamin (VITAMIN B12) 1,000 mcg, Oral, Daily   Eliquis 5 mg, Oral, 2 times daily   empagliflozin (JARDIANCE) 25 mg, Oral, Daily   Fish Oil 1,200 mg, Oral, 2 times daily   metoprolol tartrate (LOPRESSOR) 25 mg, Oral, 2 times daily   ONETOUCH ULTRA test strip No dose, route, or frequency recorded.   Ozempic (1 MG/DOSE) 2 mg, Subcutaneous, Every 7 days   pioglitazone (ACTOS) 15 mg, Oral, Daily    Social  History:  The patient  reports that he quit smoking about 16 years ago. His smoking use included cigarettes. He has never used smokeless tobacco. He reports that he does not currently use alcohol. He reports that he does not use drugs.   Family History:   The patient's family history includes COPD in his father.  ROS:  Please see the history of present illness. All other systems are reviewed and otherwise negative.   PHYSICAL EXAM:  VS:  There were no vitals taken for this visit. BMI: There is no height or weight on file to calculate BMI.  GEN- The patient is well appearing, alert and oriented x 3 today.   Lungs- Clear to ausculation bilaterally, normal work of breathing.  Heart- Irregularly irregular rate and rhythm, no murmurs, rubs or gallops Extremities- Trace RLE edema, no LLE peripheral edema, warm, dry Skin -leadless pacemaker maker insertion site well-healed  Device interrogation done today and reviewed by myself:  Battery >9 years Lead threshold, impedence, sensing stable  Rare V pacing  No changes made today  EKG is ordered. Personal review of EKG from today shows:   EKG Interpretation Date/Time:  Wednesday November 06 2022 13:21:05 EDT Ventricular Rate:  79 PR Interval:    QRS Duration:  94 QT Interval:  370 QTC Calculation: 424 R Axis:   -42  Text Interpretation: Atrial fibrillation Left axis deviation Incomplete right bundle branch block Confirmed by [name] [phone]) on 11/06/2022 3:58:42 PM    Recent Labs: 09/19/2022: Hemoglobin 15.1; Platelets 272 10/10/2022: B Natriuretic Peptide 25.5; BUN 23; Creatinine, Ser 1.01; Magnesium 2.3; Potassium 4.9; Sodium 140  No results found for requested labs within last 365 days.   CrCl cannot be calculated (Patient's most recent lab result is older than the maximum 21 days allowed.).   Wt Readings from Last 3 Encounters:  10/10/22 252 lb (114.3 kg)  09/09/22 250 lb (113.4 kg)  08/12/22 240 lb (108.9 kg)     Additional  studies reviewed include: Previous EP, cardiology notes.   ETT, 11/01/2022   down sloping ST depression (V1, V2 and V3) was noted. Exercise treadmill study, flecainide study  Resting EKG : sinus bradycardia rate 54 bpm resting blood pressure 105/74 Exercised on regular Bruce protocol  Hypotensive with exercise,  Blood pressure 79/64 at 2 minutes 50 seconds, rate 80 bpm Blood pressure 81/55 at 3 minutes 50 seconds extending into recovery Blood pressure recovered 106/82 at 1 minute in recovery rate 70 bpm No significant ST or T wave changes concerning for ischemia at peak stress or in recovery Peak heart rate 91 bpm, 59% of maximum predicted heart rate Achieved 5.5 METS, total exercise time 4 minutes   Final impression No significant ST or T wave changes or arrhythmia noted at peak stress or in recovery  CT Chest, 08/15/2022  1. Aortic root measures 4.3 cm at the sinuses of Valsalva. Recommend annual imaging followup by CTA or MRA. This recommendation follows 2010 ACCF/AHA/AATS/ACR/ASA/SCA/SCAI/SIR/STS/SVM Guidelines for the Diagnosis and Management  of Patients with Thoracic Aortic Disease. Circulation. 2010; 121: H086-V784. Aortic aneurysm NOS (ICD10-I71.9) 2. Coronary artery calcification. 3. Prominent pulmonary trunk. 4. 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  CT Cardiac morph, 08/15/2022 1. There is normal pulmonary vein drainage into the left atrium.  2. The left atrial appendage is a Windsock type with ostial size 18 x 13 mm and length 38 mm, Area 16 mm2. There is no thrombus in the left atrial appendage  3. The esophagus runs in the left atrial midline and is not in the proximity to any of the pulmonary veins.  4. Coronary calcium score 500 (LAD 55.9, RCA 444). 79th percentile for age and gender matched controls.  TTE, 04/26/2020 NORMAL LEFT VENTRICULAR SYSTOLIC FUNCTION  NORMAL RIGHT VENTRICULAR SYSTOLIC FUNCTION   TRIVIAL REGURGITATION NOTED (See above)  NO VALVULAR STENOSIS  TRIVIAL AR, MR, TR  EF 55%   ASSESSMENT AND PLAN:  #) parox AFib #) non-obs coronary atherosclerosis by CT S/p ablation 07/2022 with Dr. Angela Adam to have paroxysms of A-fib since ablation Recently initiated flecainide, routine ETT post-flec initiation with hypotension, dizziness, but without significant ST changes CT chest with significant coronary calcium Discussed with Dr. Lalla Brothers, who recommends LHC for further evaluation With ongoing lower extremity edema, will plan for [facility] Pre-procedure labs today  #) Hypercoag due to A-fib CHA2DS2-VASc Score = 3 [CHF History: 0, HTN History: 1, Diabetes History: 1, Stroke History: 0, Vascular Disease History: 0, Age Score: 1, Gender Score: 0].  Therefore, the patient's annual risk of stroke is 3.2 %. OAC 5 mg Eliquis twice daily, appropriately dosed No bleeding concerns   #) SND s/p MDT Micra leadless PPM Rare V pacing Device functioning well, see Paceart for details  #) Lower extremity edema Updated echo scheduled for tomorrow Continue to hold lasix with hypotension   #) HTN #) hypotension Hypotensive with ETT Stopped lisinopril   Informed Consent   Shared Decision Making/Informed Consent The risks [stroke (1 in 1000), death (1 in 1000), kidney failure [usually temporary] (1 in 500), bleeding (1 in 200), allergic reaction [possibly serious] (1 in 200)], benefits (diagnostic support and management of coronary artery disease) and alternatives of a cardiac catheterization were discussed in detail with Darius Brown and he is willing to proceed.          Current medicines are reviewed at length with the patient today.   The patient does not have concerns regarding his medicines.  The following changes were made today:  none  Labs/ tests ordered today include:  No orders of the defined types were placed in this encounter.    Disposition: Follow up with EP  APP  in 2-4 weeks after LHC  Follow-up with Dr. Lalla Brothers 12/2022 for routine 13-month post ablation appointment   Signed, [name], NP  11/05/22  8:10 PM  Electrophysiology [facility]

## 2022-11-05 NOTE — Progress Notes (Unsigned)
Cardiology Office Note Date:  11/05/2022  Patient ID:  Darius Merrihew., DOB Oct 21, 1955, MRN [id number] PCP:  Duanne Limerick, MD  Cardiologist:  None Electrophysiologist: Lanier Prude, MD    Chief Complaint: afib  History of Present Illness: Darius Brown. is a 67 y.o. male with PMH notable for SND s/p PPM, parox AFib, HTN; seen today for Lanier Prude, MD for acute visit due to failed exercise treadmill test.   He is s/p AF ablation 07/2022 by Dr. Lalla Brothers He was seen in A-fib clinic for routine post ablation appointment and was noted to be in A-fib.  No cardiac awareness.  He was set up for cardioversion 6/27, when presented for procedure he was found to be in sinus rhythm.   I saw him in follow-up 7/11 where he continued to have paroxysms of afib, discussed AAD and redo ablation. (Tentatively scheduled 03/17/23) Decided on flecainide and he presented for ETT 8/2. During test, he developed ?ST depression, hypotension during exercise with fatigue, dyspnea, dizziness. It was unclear whether the patient had initiated flecainide.   On follow-up today, patient has noticed increased fatigue over the last few weeks. He is unsure if it is because he actually is more fatigued or just paying more attention to his exercise level. He continues to have paroxysms of afib, does not think the overall burden has increased. He stopped his lisinopril and lasix as recommended during our previous phone call. Brings a BP log, all systolics 100-120. He does not think he has any lower extremity edema today. Continues to have good appetite.   Diligently takes eliquis BID, no missed doses. No bleeding concerns.   he denies chest pain, palpitations, dyspnea, PND, orthopnea, nausea, vomiting, dizziness, syncope, weight gain, or early satiety.     Device Information: MDT Micra, IMP 04/2021 Imp by Dr. Evette Georges, [facility]  AAD History: Amiodarone, stopped due to lack of ?A-fib Flecainide - briefly  started, stopped d/t failed ETT with concern for significant CAD  Past Medical History:  Diagnosis Date   Diabetes mellitus without complication (HCC)    Dysrhythmia    Hyperlipidemia    Hypertension     Past Surgical History:  Procedure Laterality Date   APPENDECTOMY     ATRIAL FIBRILLATION ABLATION N/A 08/12/2022   Procedure: ATRIAL FIBRILLATION ABLATION;  Surgeon: Lanier Prude, MD;  [facility]: [facility];  Service: Cardiovascular;  Laterality: N/A;   CARDIOVERSION N/A 09/26/2022   Procedure: CARDIOVERSION;  Surgeon: Iran Ouch, MD;  Location: [facility];  Service: Cardiovascular;  Laterality: N/A;   COLONOSCOPY  2007   repeat in 10 years   COLONOSCOPY WITH PROPOFOL N/A 06/27/2016   Procedure: COLONOSCOPY WITH PROPOFOL;  Surgeon: Midge Minium, MD;  Location: [facility];  Service: Endoscopy;  Laterality: N/A;  Diabetic - insulin and oral meds   HERNIA REPAIR     x 2   KNEE SURGERY Right    KNEE SURGERY Left    PACEMAKER LEADLESS INSERTION N/A 04/05/2021   Procedure: PACEMAKER LEADLESS INSERTION;  Surgeon: Marcina Millard, MD;  Location: [facility];  Service: Cardiovascular;  Laterality: N/A;   POLYPECTOMY  06/27/2016   Procedure: POLYPECTOMY;  Surgeon: Midge Minium, MD;  Location: [facility];  Service: Endoscopy;;   TONSILLECTOMY      Current Outpatient Medications  Medication Instructions   acetaminophen (TYLENOL) 650 mg, Oral, Daily   atorvastatin (LIPITOR) 20 mg, Oral, Daily   cetirizine (ZYRTEC) 10 mg,  Oral, Daily   cyanocobalamin (VITAMIN B12) 1,000 mcg, Oral, Daily   Eliquis 5 mg, Oral, 2 times daily   empagliflozin (JARDIANCE) 25 mg, Oral, Daily   Fish Oil 1,200 mg, Oral, 2 times daily   metoprolol tartrate (LOPRESSOR) 25 mg, Oral, 2 times daily   ONETOUCH ULTRA test strip No dose, route, or frequency recorded.   Ozempic (1 MG/DOSE) 2 mg, Subcutaneous, Every 7 days   pioglitazone (ACTOS) 15 mg, Oral, Daily    Social  History:  The patient  reports that he quit smoking about 16 years ago. His smoking use included cigarettes. He has never used smokeless tobacco. He reports that he does not currently use alcohol. He reports that he does not use drugs.   Family History:   The patient's family history includes COPD in his father.  ROS:  Please see the history of present illness. All other systems are reviewed and otherwise negative.   PHYSICAL EXAM:  VS:  There were no vitals taken for this visit. BMI: There is no height or weight on file to calculate BMI.  GEN- The patient is well appearing, alert and oriented x 3 today.   Lungs- Clear to ausculation bilaterally, normal work of breathing.  Heart- Irregularly irregular rate and rhythm, no murmurs, rubs or gallops Extremities- Trace RLE edema, no LLE peripheral edema, warm, dry Skin -leadless pacemaker maker insertion site well-healed  Device interrogation done today and reviewed by myself:  Battery >9 years Lead threshold, impedence, sensing stable  Rare V pacing  No changes made today  EKG is ordered. Personal review of EKG from today shows:   EKG Interpretation Date/Time:  Wednesday November 06 2022 13:21:05 EDT Ventricular Rate:  79 PR Interval:    QRS Duration:  94 QT Interval:  370 QTC Calculation: 424 R Axis:   -42  Text Interpretation: Atrial fibrillation Left axis deviation Incomplete right bundle branch block Confirmed by [name] [phone]) on 11/06/2022 3:58:42 PM    Recent Labs: 09/19/2022: Hemoglobin 15.1; Platelets 272 10/10/2022: B Natriuretic Peptide 25.5; BUN 23; Creatinine, Ser 1.01; Magnesium 2.3; Potassium 4.9; Sodium 140  No results found for requested labs within last 365 days.   CrCl cannot be calculated (Patient's most recent lab result is older than the maximum 21 days allowed.).   Wt Readings from Last 3 Encounters:  10/10/22 252 lb (114.3 kg)  09/09/22 250 lb (113.4 kg)  08/12/22 240 lb (108.9 kg)     Additional  studies reviewed include: Previous EP, cardiology notes.   ETT, 11/01/2022   down sloping ST depression (V1, V2 and V3) was noted. Exercise treadmill study, flecainide study  Resting EKG : sinus bradycardia rate 54 bpm resting blood pressure 105/74 Exercised on regular Bruce protocol  Hypotensive with exercise,  Blood pressure 79/64 at 2 minutes 50 seconds, rate 80 bpm Blood pressure 81/55 at 3 minutes 50 seconds extending into recovery Blood pressure recovered 106/82 at 1 minute in recovery rate 70 bpm No significant ST or T wave changes concerning for ischemia at peak stress or in recovery Peak heart rate 91 bpm, 59% of maximum predicted heart rate Achieved 5.5 METS, total exercise time 4 minutes   Final impression No significant ST or T wave changes or arrhythmia noted at peak stress or in recovery  CT Chest, 08/15/2022  1. Aortic root measures 4.3 cm at the sinuses of Valsalva. Recommend annual imaging followup by CTA or MRA. This recommendation follows 2010 ACCF/AHA/AATS/ACR/ASA/SCA/SCAI/SIR/STS/SVM Guidelines for the Diagnosis and Management  of Patients with Thoracic Aortic Disease. Circulation. 2010; 121: H086-V784. Aortic aneurysm NOS (ICD10-I71.9) 2. Coronary artery calcification. 3. Prominent pulmonary trunk. 4. 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  CT Cardiac morph, 08/15/2022 1. There is normal pulmonary vein drainage into the left atrium.  2. The left atrial appendage is a Windsock type with ostial size 18 x 13 mm and length 38 mm, Area 16 mm2. There is no thrombus in the left atrial appendage  3. The esophagus runs in the left atrial midline and is not in the proximity to any of the pulmonary veins.  4. Coronary calcium score 500 (LAD 55.9, RCA 444). 79th percentile for age and gender matched controls.  TTE, 04/26/2020 NORMAL LEFT VENTRICULAR SYSTOLIC FUNCTION  NORMAL RIGHT VENTRICULAR SYSTOLIC FUNCTION   TRIVIAL REGURGITATION NOTED (See above)  NO VALVULAR STENOSIS  TRIVIAL AR, MR, TR  EF 55%   ASSESSMENT AND PLAN:  #) parox AFib #) non-obs coronary atherosclerosis by CT S/p ablation 07/2022 with Dr. Angela Adam to have paroxysms of A-fib since ablation Recently initiated flecainide, routine ETT post-flec initiation with hypotension, dizziness, but without significant ST changes CT chest with significant coronary calcium Discussed with Dr. Lalla Brothers, who recommends LHC for further evaluation With ongoing lower extremity edema, will plan for [facility] Pre-procedure labs today  #) Hypercoag due to A-fib CHA2DS2-VASc Score = 3 [CHF History: 0, HTN History: 1, Diabetes History: 1, Stroke History: 0, Vascular Disease History: 0, Age Score: 1, Gender Score: 0].  Therefore, the patient's annual risk of stroke is 3.2 %. OAC 5 mg Eliquis twice daily, appropriately dosed No bleeding concerns   #) SND s/p MDT Micra leadless PPM Rare V pacing Device functioning well, see Paceart for details  #) Lower extremity edema Updated echo scheduled for tomorrow Continue to hold lasix with hypotension   #) HTN #) hypotension Hypotensive with ETT Stopped lisinopril   Informed Consent   Shared Decision Making/Informed Consent The risks [stroke (1 in 1000), death (1 in 1000), kidney failure [usually temporary] (1 in 500), bleeding (1 in 200), allergic reaction [possibly serious] (1 in 200)], benefits (diagnostic support and management of coronary artery disease) and alternatives of a cardiac catheterization were discussed in detail with Darius Brown and he is willing to proceed.          Current medicines are reviewed at length with the patient today.   The patient does not have concerns regarding his medicines.  The following changes were made today:  none  Labs/ tests ordered today include:  No orders of the defined types were placed in this encounter.    Disposition: Follow up with EP  APP  in 2-4 weeks after LHC  Follow-up with Dr. Lalla Brothers 12/2022 for routine 13-month post ablation appointment   Signed, [name], NP  11/05/22  8:10 PM  Electrophysiology [facility]

## 2022-11-06 ENCOUNTER — Ambulatory Visit: Payer: Federal, State, Local not specified - PPO | Attending: Cardiology | Admitting: Cardiology

## 2022-11-06 ENCOUNTER — Encounter: Payer: Self-pay | Admitting: Cardiology

## 2022-11-06 VITALS — BP 102/80 | HR 79 | Ht 70.0 in | Wt 253.6 lb

## 2022-11-06 DIAGNOSIS — I480 Paroxysmal atrial fibrillation: Secondary | ICD-10-CM | POA: Insufficient documentation

## 2022-11-06 DIAGNOSIS — I495 Sick sinus syndrome: Secondary | ICD-10-CM | POA: Diagnosis not present

## 2022-11-06 DIAGNOSIS — I2510 Atherosclerotic heart disease of native coronary artery without angina pectoris: Secondary | ICD-10-CM | POA: Insufficient documentation

## 2022-11-06 DIAGNOSIS — Z950 Presence of cardiac pacemaker: Secondary | ICD-10-CM | POA: Diagnosis not present

## 2022-11-06 DIAGNOSIS — D6869 Other thrombophilia: Secondary | ICD-10-CM | POA: Diagnosis not present

## 2022-11-06 LAB — CUP PACEART INCLINIC DEVICE CHECK
Date Time Interrogation Session: 20240807155410
Implantable Pulse Generator Implant Date: 20230105

## 2022-11-06 NOTE — Patient Instructions (Addendum)
Medication Instructions:  The current medical regimen is effective;  continue present plan and medications.  *If you need a refill on your cardiac medications before your next appointment, please call your pharmacy*   Lab Work: Your provider would like for you to have following labs drawn today CBC, BMET.   If you have labs (blood work) drawn today and your tests are completely normal, you will receive your results only by: MyChart Message (if you have MyChart) OR A paper copy in the mail If you have any lab test that is abnormal or we need to change your treatment, we will call you to review the results.   Testing/Procedures: Your physician has requested that you have a cardiac catheterization. Cardiac catheterization is used to diagnose and/or treat various heart conditions. Doctors may recommend this procedure for a number of different reasons. The most common reason is to evaluate chest pain. Chest pain can be a symptom of coronary artery disease (CAD), and cardiac catheterization can show whether plaque is narrowing or blocking your heart's arteries. This procedure is also used to evaluate the valves, as well as measure the blood flow and oxygen levels in different parts of your heart.    Follow-Up: At [facility], you and your health needs are our priority.  As part of our continuing mission to provide you with exceptional heart care, we have created designated Provider Care Teams.  These Care Teams include your primary Cardiologist (physician) and Advanced Practice Providers (APPs -  Physician Assistants and Nurse Practitioners) who all work together to provide you with the care you need, when you need it.  We recommend signing up for the patient portal called "MyChart".  Sign up information is provided on this After Visit Summary.  MyChart is used to connect with patients for Virtual Visits (Telemedicine).  Patients are able to view lab/test results, encounter notes, upcoming  appointments, etc.  Non-urgent messages can be sent to your provider as well.   To learn more about what you can do with MyChart, go to [email or web address].    Your next appointment:   2-4 weeks post CATH   Provider:   [name], NP  Other Instructions       Cardiac/Peripheral Catheterization   You are scheduled for a Cardiac Catheterization on Tuesday, August 13 with Dr. Cristal Deer End.  1. Please arrive at the [facility] Entrance of [facility], [address], [place] [address] at 7:30 AM (This is 1 hour(s) prior to your procedure time).  Proceed to the Check-In Desk directly inside the entrance.  Procedure Parking: Use the entrance off of the [address] side of the hospital. Turn right upon entering and follow the driveway to parking that is directly in front of the [facility] Vascular Center. There is no valet parking available at this entrance, however there is an awning directly in front of the [facility] Vascular Center for drop off/ pick up for patients.      Special note: Every effort is made to have your procedure done on time. Please understand that emergencies sometimes delay scheduled procedures.  2. Diet: Do not eat solid foods after midnight.  You may have clear liquids until 5 AM the day of the procedure.  3. Labs: You will need to have blood drawn CBC, BMET. You do not need to be fasting.  4. Medication instructions in preparation for your procedure:   Contrast Allergy: No   Stop Taking Eliquis on Sunday, August  11th, 2024  On the morning of your procedure, take Aspirin 81 mg and any morning medicines NOT listed above.  You may use sips of water.  5. Plan to go home the same day, you will only stay overnight if medically necessary. 6. You MUST have a responsible adult to drive you home. 7. An adult MUST be with you the first 24 hours after you arrive home. 8. Bring a current list of your medications, and the last time and date medication  taken. 9. Bring ID and current insurance cards. 10.Please wear clothes that are easy to get on and off and wear slip-on shoes.  Thank you for allowing [place] to care for you!   -- [place] [facility]

## 2022-11-07 ENCOUNTER — Ambulatory Visit: Payer: Federal, State, Local not specified - PPO | Attending: Cardiology

## 2022-11-07 DIAGNOSIS — R600 Localized edema: Secondary | ICD-10-CM | POA: Insufficient documentation

## 2022-11-07 DIAGNOSIS — I480 Paroxysmal atrial fibrillation: Secondary | ICD-10-CM | POA: Diagnosis not present

## 2022-11-07 LAB — ECHOCARDIOGRAM COMPLETE
Area-P 1/2: 4.39 cm2
S' Lateral: 2.5 cm

## 2022-11-08 ENCOUNTER — Other Ambulatory Visit (HOSPITAL_COMMUNITY): Payer: Medicare Other

## 2022-11-11 ENCOUNTER — Telehealth: Payer: Self-pay | Admitting: *Deleted

## 2022-11-11 ENCOUNTER — Encounter: Payer: Medicare Other | Admitting: Cardiology

## 2022-11-11 NOTE — Telephone Encounter (Signed)
Spoke with patient and reviewed instructions for his procedure tomorrow. He verbalized understanding of all instructions with no further questions at this time.

## 2022-11-12 ENCOUNTER — Encounter
Admission: RE | Disposition: A | Discharge status: Home / Self Care | Payer: Self-pay | Source: Home / Self Care | Attending: Internal Medicine

## 2022-11-12 ENCOUNTER — Other Ambulatory Visit: Payer: Self-pay

## 2022-11-12 ENCOUNTER — Ambulatory Visit
Admission: RE | Admit: 2022-11-12 | Discharge: 2022-11-12 | Disposition: A | Discharge status: Home / Self Care | Payer: Federal, State, Local not specified - PPO | Attending: Internal Medicine | Admitting: Internal Medicine

## 2022-11-12 ENCOUNTER — Encounter: Payer: Self-pay | Admitting: Internal Medicine

## 2022-11-12 DIAGNOSIS — I10 Essential (primary) hypertension: Secondary | ICD-10-CM | POA: Insufficient documentation

## 2022-11-12 DIAGNOSIS — D6869 Other thrombophilia: Secondary | ICD-10-CM | POA: Diagnosis not present

## 2022-11-12 DIAGNOSIS — I495 Sick sinus syndrome: Secondary | ICD-10-CM | POA: Diagnosis not present

## 2022-11-12 DIAGNOSIS — I480 Paroxysmal atrial fibrillation: Secondary | ICD-10-CM | POA: Insufficient documentation

## 2022-11-12 DIAGNOSIS — Z7901 Long term (current) use of anticoagulants: Secondary | ICD-10-CM | POA: Diagnosis not present

## 2022-11-12 DIAGNOSIS — Z87891 Personal history of nicotine dependence: Secondary | ICD-10-CM | POA: Diagnosis not present

## 2022-11-12 DIAGNOSIS — I2510 Atherosclerotic heart disease of native coronary artery without angina pectoris: Secondary | ICD-10-CM | POA: Insufficient documentation

## 2022-11-12 DIAGNOSIS — Z950 Presence of cardiac pacemaker: Secondary | ICD-10-CM | POA: Diagnosis not present

## 2022-11-12 DIAGNOSIS — R0609 Other forms of dyspnea: Secondary | ICD-10-CM | POA: Insufficient documentation

## 2022-11-12 DIAGNOSIS — R9439 Abnormal result of other cardiovascular function study: Secondary | ICD-10-CM | POA: Diagnosis not present

## 2022-11-12 HISTORY — PX: RIGHT/LEFT HEART CATH AND CORONARY ANGIOGRAPHY: CATH118266

## 2022-11-12 LAB — POCT I-STAT EG7
Acid-base deficit: 3 mmol/L — ABNORMAL HIGH (ref 0.0–2.0)
Bicarbonate: 23.8 mmol/L (ref 20.0–28.0)
Calcium, Ion: 1.19 mmol/L (ref 1.15–1.40)
HCT: 42 % (ref 39.0–52.0)
Hemoglobin: 14.3 g/dL (ref 13.0–17.0)
O2 Saturation: 64 %
Potassium: 3.8 mmol/L (ref 3.5–5.1)
Sodium: 141 mmol/L (ref 135–145)
TCO2: 25 mmol/L (ref 22–32)
pCO2, Ven: 45.9 mmHg (ref 44–60)
pH, Ven: 7.323 (ref 7.25–7.43)
pO2, Ven: 36 mmHg (ref 32–45)

## 2022-11-12 LAB — POCT I-STAT 7, (LYTES, BLD GAS, ICA,H+H)
Acid-base deficit: 3 mmol/L — ABNORMAL HIGH (ref 0.0–2.0)
Bicarbonate: 23 mmol/L (ref 20.0–28.0)
Calcium, Ion: 1.18 mmol/L (ref 1.15–1.40)
HCT: 43 % (ref 39.0–52.0)
Hemoglobin: 14.6 g/dL (ref 13.0–17.0)
O2 Saturation: 92 %
Potassium: 3.8 mmol/L (ref 3.5–5.1)
Sodium: 140 mmol/L (ref 135–145)
TCO2: 24 mmol/L (ref 22–32)
pCO2 arterial: 41.9 mmHg (ref 32–48)
pH, Arterial: 7.348 — ABNORMAL LOW (ref 7.35–7.45)
pO2, Arterial: 66 mmHg — ABNORMAL LOW (ref 83–108)

## 2022-11-12 LAB — GLUCOSE, CAPILLARY: Glucose-Capillary: 134 mg/dL — ABNORMAL HIGH (ref 70–99)

## 2022-11-12 SURGERY — RIGHT/LEFT HEART CATH AND CORONARY ANGIOGRAPHY
Anesthesia: Moderate Sedation | Laterality: Bilateral

## 2022-11-12 MED ORDER — LABETALOL HCL 5 MG/ML IV SOLN: 10.0000 mg | INTRAVENOUS | Status: DC | PRN

## 2022-11-12 MED ORDER — MIDAZOLAM HCL 2 MG/2ML IJ SOLN
INTRAMUSCULAR | Status: DC | PRN
  Administered 2022-11-12: 1 mg via INTRAVENOUS

## 2022-11-12 MED ORDER — FENTANYL CITRATE (PF) 100 MCG/2ML IJ SOLN
INTRAMUSCULAR | Status: DC | PRN
  Administered 2022-11-12: 25 ug via INTRAVENOUS

## 2022-11-12 MED ORDER — ASPIRIN 81 MG PO CHEW: 81.0000 mg | CHEWABLE_TABLET | ORAL | Status: DC

## 2022-11-12 MED ORDER — ACETAMINOPHEN 325 MG PO TABS: 650.0000 mg | ORAL_TABLET | ORAL | Status: DC | PRN

## 2022-11-12 MED ORDER — VERAPAMIL HCL 2.5 MG/ML IV SOLN
INTRAVENOUS | Status: AC
  Filled 2022-11-12: qty 2

## 2022-11-12 MED ORDER — SODIUM CHLORIDE 0.9% FLUSH: 3.0000 mL | INTRAVENOUS | Status: DC | PRN

## 2022-11-12 MED ORDER — HEPARIN SODIUM (PORCINE) 1000 UNIT/ML IJ SOLN
INTRAMUSCULAR | Status: AC
  Filled 2022-11-12: qty 10

## 2022-11-12 MED ORDER — LIDOCAINE HCL 1 % IJ SOLN
INTRAMUSCULAR | Status: AC
  Filled 2022-11-12: qty 20

## 2022-11-12 MED ORDER — HYDRALAZINE HCL 20 MG/ML IJ SOLN: 10.0000 mg | INTRAMUSCULAR | Status: DC | PRN

## 2022-11-12 MED ORDER — LIDOCAINE HCL (PF) 1 % IJ SOLN
INTRAMUSCULAR | Status: DC | PRN
  Administered 2022-11-12 (×2): 2 mL

## 2022-11-12 MED ORDER — SODIUM CHLORIDE 0.9 % IV SOLN: 250.0000 mL | INTRAVENOUS | Status: DC | PRN

## 2022-11-12 MED ORDER — IOHEXOL 300 MG/ML  SOLN
INTRAMUSCULAR | Status: DC | PRN
  Administered 2022-11-12: 50 mL

## 2022-11-12 MED ORDER — ONDANSETRON HCL 4 MG/2ML IJ SOLN: 4.0000 mg | Freq: Four times a day (QID) | INTRAMUSCULAR | Status: DC | PRN

## 2022-11-12 MED ORDER — HEPARIN (PORCINE) IN NACL 2000-0.9 UNIT/L-% IV SOLN
INTRAVENOUS | Status: DC | PRN
  Administered 2022-11-12: 1000 mL

## 2022-11-12 MED ORDER — SODIUM CHLORIDE 0.9 % WEIGHT BASED INFUSION: 1.0000 mL/kg/h | INTRAVENOUS | Status: DC

## 2022-11-12 MED ORDER — SODIUM CHLORIDE 0.9 % IV SOLN: INTRAVENOUS | Status: DC

## 2022-11-12 MED ORDER — VERAPAMIL HCL 2.5 MG/ML IV SOLN
INTRAVENOUS | Status: DC | PRN
  Administered 2022-11-12 (×2): 2.5 mg via INTRA_ARTERIAL

## 2022-11-12 MED ORDER — SODIUM CHLORIDE 0.9 % WEIGHT BASED INFUSION: 3.0000 mL/kg/h | INTRAVENOUS | Status: AC

## 2022-11-12 MED ORDER — FENTANYL CITRATE (PF) 100 MCG/2ML IJ SOLN
INTRAMUSCULAR | Status: AC
  Filled 2022-11-12: qty 2

## 2022-11-12 MED ORDER — HEPARIN SODIUM (PORCINE) 1000 UNIT/ML IJ SOLN
INTRAMUSCULAR | Status: DC | PRN
  Administered 2022-11-12: 5000 [IU] via INTRAVENOUS

## 2022-11-12 MED ORDER — SODIUM CHLORIDE 0.9% FLUSH: 3.0000 mL | Freq: Two times a day (BID) | INTRAVENOUS | Status: DC

## 2022-11-12 MED ORDER — HEPARIN (PORCINE) IN NACL 1000-0.9 UT/500ML-% IV SOLN
INTRAVENOUS | Status: AC
  Filled 2022-11-12: qty 1000

## 2022-11-12 MED ORDER — MIDAZOLAM HCL 2 MG/2ML IJ SOLN
INTRAMUSCULAR | Status: AC
  Filled 2022-11-12: qty 2

## 2022-11-12 SURGICAL SUPPLY — 12 items
CATH BALLN WEDGE 5F 110CM (CATHETERS) IMPLANT
CATH INFINITI AMBI 5FR TG (CATHETERS) IMPLANT
DEVICE RAD TR BAND REGULAR (VASCULAR PRODUCTS) IMPLANT
DRAPE BRACHIAL (DRAPES) IMPLANT
GLIDESHEATH SLEND SS 6F .021 (SHEATH) IMPLANT
GUIDEWIRE INQWIRE 1.5J.035X260 (WIRE) IMPLANT
INQWIRE 1.5J .035X260CM (WIRE) ×1
PACK CARDIAC CATH (CUSTOM PROCEDURE TRAY) ×1 IMPLANT
PROTECTION STATION PRESSURIZED (MISCELLANEOUS) ×1
SET ATX-X65L (MISCELLANEOUS) IMPLANT
SHEATH GLIDE SLENDER 4/5FR (SHEATH) IMPLANT
STATION PROTECTION PRESSURIZED (MISCELLANEOUS) IMPLANT

## 2022-11-12 NOTE — Brief Op Note (Signed)
BRIEF CARDIAC CATHETERIZATION NOTE  11/12/2022  10:06 AM  PATIENT:  Darius Brown.  67 y.o. male  PRE-OPERATIVE DIAGNOSIS:  CAD, dyspnea on exertion, and abnormal stress test  POST-OPERATIVE DIAGNOSIS:  Same  PROCEDURE:  Procedure(s): RIGHT/LEFT HEART CATH AND CORONARY ANGIOGRAPHY (Bilateral)  FINDINGS: Non-obstructive CAD. Normal left heart filling pressures. Upper normal to mildly elevated right heart and pulmonary artery pressures. Borderline low Fick cardiac output/index.  RECOMMENDATIONS: Medical therapy and risk factor modification to prevent progression of CAD. Maintain net even fluid balance. If no evidence of bleeding/vascular injury, restart apixaban tomorrow AM. Ongoing management of a-fib per EP.  Yvonne Kendall, MD [facility]

## 2022-11-12 NOTE — Interval H&P Note (Signed)
History and Physical Interval Note:  11/12/2022 8:51 AM  Darius Brown.  has presented today for surgery, with the diagnosis of shortness of breath and abnormal stress test.  The various methods of treatment have been discussed with the patient and family. After consideration of risks, benefits and other options for treatment, the patient has consented to  Procedure(s): RIGHT/LEFT HEART CATH AND CORONARY ANGIOGRAPHY (Bilateral) as a surgical intervention.  The patient's history has been reviewed, patient examined, no change in status, stable for surgery.  I have reviewed the patient's chart and labs.  Questions were answered to the patient's satisfaction.    Cath Lab Visit (complete for each Cath Lab visit)  Clinical Evaluation Leading to the Procedure:   ACS: No.  Non-ACS:    Anginal Classification: CCS III  Anti-ischemic medical therapy: Minimal Therapy (1 class of medications)  Non-Invasive Test Results: Intermediate-risk stress test findings: cardiac mortality 1-3%/year  Prior CABG: No previous CABG  Darius Brown

## 2022-11-12 NOTE — Progress Notes (Signed)
Dr. Okey Dupre in to see patient pre-procedure

## 2022-11-13 ENCOUNTER — Encounter: Payer: Medicare Other | Admitting: Cardiology

## 2022-11-13 ENCOUNTER — Encounter: Payer: Self-pay | Admitting: Internal Medicine

## 2022-11-21 DIAGNOSIS — G4733 Obstructive sleep apnea (adult) (pediatric): Secondary | ICD-10-CM | POA: Diagnosis not present

## 2022-11-26 ENCOUNTER — Other Ambulatory Visit: Payer: Self-pay | Admitting: Pulmonary Disease

## 2022-11-26 DIAGNOSIS — G4733 Obstructive sleep apnea (adult) (pediatric): Secondary | ICD-10-CM

## 2022-12-03 DIAGNOSIS — G4733 Obstructive sleep apnea (adult) (pediatric): Secondary | ICD-10-CM | POA: Diagnosis not present

## 2022-12-05 ENCOUNTER — Ambulatory Visit
Admission: RE | Admit: 2022-12-05 | Discharge: 2022-12-05 | Disposition: A | Discharge status: Home / Self Care | Payer: Federal, State, Local not specified - PPO | Source: Ambulatory Visit | Attending: Pulmonary Disease | Admitting: Pulmonary Disease

## 2022-12-05 DIAGNOSIS — G4733 Obstructive sleep apnea (adult) (pediatric): Secondary | ICD-10-CM | POA: Insufficient documentation

## 2022-12-05 DIAGNOSIS — M7989 Other specified soft tissue disorders: Secondary | ICD-10-CM | POA: Diagnosis not present

## 2022-12-05 NOTE — Progress Notes (Signed)
Cardiology Office Note Date:  12/06/2022  Patient ID:  Darius Montiel., DOB 04-Jun-1955, MRN [id number] PCP:  Duanne Limerick, MD  Cardiologist:  None Electrophysiologist: Lanier Prude, MD    Chief Complaint: afib  History of Present Illness: Darius Rudisill. is a 67 y.o. male with PMH notable for SND s/p PPM, parox AFib, HTN; seen today for Lanier Prude, MD for post-procedure follow-up.  He is s/p AF ablation 07/2022 by Dr. Lalla Brothers I saw him in follow-up 7/11 where he continued to have paroxysms of afib, discussed AAD and redo ablation. (Tentatively scheduled 03/17/23) Decided on flecainide and he presented for ETT 8/2. During test, he developed ?ST depression, hypotension during exercise with fatigue, dyspnea, dizziness. There was concern for hypovolemia, so lisinopril and lasix stopped. I saw him in clinic 8/7 where he continued to note fatigue with stable afib burden. It was recommended to undergo Operating Room Services for further eval given ongoing symptoms with significant CAD on cardiac CT. LHC showed nonobs CAD  On follow-up today, he continues to have intermittent palpitations and fatigue, has not noticed any change. He brings in a BP and pulse log - BPs 110-120s systolics, rarely 130-140. Pulses continue to show paroxysms of afib about every 1-3 days.  He has continued to use CPAP nightly, working with Pulm at [facility]. Trying different masks to see if one is more comfortable.  He continues to take eliquis BID, no missed doses, no bleeding concerns. His lower leg edema is stable, has not noticed any change with stopping lasix.   he denies chest pain, PND, orthopnea, nausea, vomiting, dizziness, syncope, weight gain, or early satiety.     Device Information: MDT Micra, IMP 04/2021 Implanted by Dr. Darrold Junker, [facility]  AAD History: Amiodarone, stopped due to ?lack of A-fib Flecainide - briefly started, stopped d/t failed ETT with concern for significant CAD  Past Medical History:   Diagnosis Date   Diabetes mellitus without complication (HCC)    Dysrhythmia    Hyperlipidemia    Hypertension     Past Surgical History:  Procedure Laterality Date   APPENDECTOMY     ATRIAL FIBRILLATION ABLATION N/A 08/12/2022   Procedure: ATRIAL FIBRILLATION ABLATION;  Surgeon: Lanier Prude, MD;  Location: [facility];  Service: Cardiovascular;  Laterality: N/A;   CARDIOVERSION N/A 09/26/2022   Procedure: CARDIOVERSION;  Surgeon: Iran Ouch, MD;  Location: [facility] ORS;  Service: Cardiovascular;  Laterality: N/A;   COLONOSCOPY  2007   repeat in 10 years   COLONOSCOPY WITH PROPOFOL N/A 06/27/2016   Procedure: COLONOSCOPY WITH PROPOFOL;  Surgeon: Midge Minium, MD;  Location: [facility];  Service: Endoscopy;  Laterality: N/A;  Diabetic - insulin and oral meds   HERNIA REPAIR     x 2   KNEE SURGERY Right    KNEE SURGERY Left    PACEMAKER LEADLESS INSERTION N/A 04/05/2021   Procedure: PACEMAKER LEADLESS INSERTION;  Surgeon: Marcina Millard, MD;  Location: [facility] INVASIVE CV LAB;  Service: Cardiovascular;  Laterality: N/A;   POLYPECTOMY  06/27/2016   Procedure: POLYPECTOMY;  Surgeon: Midge Minium, MD;  Location: [facility];  Service: Endoscopy;;   RIGHT/LEFT HEART CATH AND CORONARY ANGIOGRAPHY Bilateral 11/12/2022   Procedure: RIGHT/LEFT HEART CATH AND CORONARY ANGIOGRAPHY;  Surgeon: Yvonne Kendall, MD;  Location: [facility] INVASIVE CV LAB;  Service: Cardiovascular;  Laterality: Bilateral;   TONSILLECTOMY      Current Outpatient Medications  Medication Instructions   acetaminophen (TYLENOL) 650 mg, Oral,  Daily   atorvastatin (LIPITOR) 20 mg, Oral, Daily   cetirizine (ZYRTEC) 10 mg, Oral, Daily   cyanocobalamin (VITAMIN B12) 1,000 mcg, Oral, Daily   Eliquis 5 mg, Oral, 2 times daily   empagliflozin (JARDIANCE) 25 mg, Oral, Daily   Fish Oil 1,200 mg, Oral, 2 times daily   MAGNESIUM GLYCINATE PO Oral, 240 mg qd   metoprolol tartrate (LOPRESSOR) 25 mg,  Oral, 2 times daily   Multiple Vitamins-Minerals (MENS MULTIVITAMIN PO) Oral   ONETOUCH ULTRA test strip No dose, route, or frequency recorded.   Ozempic (1 MG/DOSE) 2 mg, Subcutaneous, Every 7 days   pioglitazone (ACTOS) 15 mg, Oral, Daily    Social History:  The patient  reports that he quit smoking about 16 years ago. His smoking use included cigarettes. He has never used smokeless tobacco. He reports that he does not currently use alcohol. He reports that he does not use drugs.   Family History:   The patient's family history includes COPD in his father.  ROS:  Please see the history of present illness. All other systems are reviewed and otherwise negative.   PHYSICAL EXAM:  VS:  BP 108/78 (BP Location: Left Arm, Patient Position: Sitting, Cuff Size: Large)   Pulse 77   Ht 5\' 11"  (1.803 m)   Wt 249 lb 6 oz (113.1 kg)   SpO2 98%   BMI 34.78 kg/m  BMI: Body mass index is 34.78 kg/m.  GEN- The patient is well appearing, alert and oriented x 3 today.   Lungs- Clear to ausculation bilaterally, normal work of breathing.  Heart- Regular rate and rhythm, no murmurs, rubs or gallops Extremities- Trace LE edema, no LLE peripheral edema, warm, dry Skin -leadless pacemaker maker insertion site well-healed  Device interrogation done today and reviewed by myself:  Battery >9 years Lead threshold, impedence, sensing stable  Rare V pacing No changes made today   EKG is ordered. Personal review of EKG from today shows:   EKG Interpretation Date/Time:  Friday December 06 2022 10:50:18 EDT Ventricular Rate:  77 PR Interval:  222 QRS Duration:  130 QT Interval:  380 QTC Calculation: 430 R Axis:   -47  Text Interpretation: Sinus rhythm with 1st degree A-V block Left anterior fascicular block Right bundle branch block Confirmed by [name] [phone]) on 12/06/2022 10:54:11 AM    Recent Labs: 10/10/2022: B Natriuretic Peptide 25.5; Magnesium 2.3 11/06/2022: BUN 22; Creatinine, Ser 0.88;  Platelets 297 11/12/2022: Hemoglobin 14.3; Potassium 3.8; Sodium 141  No results found for requested labs within last 365 days.   CrCl cannot be calculated (Patient's most recent lab result is older than the maximum 21 days allowed.).   Wt Readings from Last 3 Encounters:  12/06/22 249 lb 6 oz (113.1 kg)  11/12/22 250 lb (113.4 kg)  11/06/22 253 lb 9.6 oz (115 kg)     Additional studies reviewed include: Previous EP, cardiology notes.   R/LHC, 11/12/2022 Nonobstructive coronary artery disease with ectatic right coronary artery and focal 40% mid vessel stenosis.  No significant disease noted in the left coronary artery. Normal left heart filling pressures. Upper normal to mildly elevated right heart and pulmonary artery pressures. Borderline low Fick cardiac output/index.  TTE, 11/07/2022  1. Left ventricular ejection fraction, by estimation, is 55 to 60%. The left ventricle has normal function. The left ventricle has no regional wall motion abnormalities. Left ventricular diastolic parameters were normal. The average left ventricular global longitudinal strain is -17.8 %. The global longitudinal  strain is normal.   2. Right ventricular systolic function is low normal. The right ventricular size is mildly enlarged.   3. Left atrial size was mildly dilated.   4. The mitral valve is normal in structure. No evidence of mitral valve regurgitation.   5. The aortic valve is grossly normal. Aortic valve regurgitation is mild.   6. The inferior vena cava is normal in size with greater than 50% respiratory variability, suggesting right atrial pressure of 3 mmHg.   ETT, 11/01/2022   down sloping ST depression (V1, V2 and V3) was noted. Exercise treadmill study, flecainide study  Resting EKG : sinus bradycardia rate 54 bpm resting blood pressure 105/74 Exercised on regular Bruce protocol  Hypotensive with exercise,  Blood pressure 79/64 at 2 minutes 50 seconds, rate 80 bpm Blood pressure 81/55 at 3  minutes 50 seconds extending into recovery Blood pressure recovered 106/82 at 1 minute in recovery rate 70 bpm No significant ST or T wave changes concerning for ischemia at peak stress or in recovery Peak heart rate 91 bpm, 59% of maximum predicted heart rate Achieved 5.5 METS, total exercise time 4 minutes   Final impression No significant ST or T wave changes or arrhythmia noted at peak stress or in recovery  CT Chest, 08/15/2022  1. Aortic root measures 4.3 cm at the sinuses of Valsalva. Recommend annual imaging followup by CTA or MRA. This recommendation follows 2010 ACCF/AHA/AATS/ACR/ASA/SCA/SCAI/SIR/STS/SVM Guidelines for the Diagnosis and Management of Patients with Thoracic Aortic Disease. Circulation. 2010; 121: Z610-R604. Aortic aneurysm NOS (ICD10-I71.9) 2. Coronary artery calcification. 3. Prominent pulmonary trunk. 4. 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  CT Cardiac morph, 08/15/2022 1. There is normal pulmonary vein drainage into the left atrium.  2. The left atrial appendage is a Windsock type with ostial size 18 x 13 mm and length 38 mm, Area 16 mm2. There is no thrombus in the left atrial appendage  3. The esophagus runs in the left atrial midline and is not in the proximity to any of the pulmonary veins.  4. Coronary calcium score 500 (LAD 55.9, RCA 444). 79th percentile for age and gender matched controls.  TTE, 04/26/2020 NORMAL LEFT VENTRICULAR SYSTOLIC FUNCTION  NORMAL RIGHT VENTRICULAR SYSTOLIC FUNCTION  TRIVIAL REGURGITATION NOTED (See above)  NO VALVULAR STENOSIS  TRIVIAL AR, MR, TR  EF 55%   ASSESSMENT AND PLAN:  #) parox AFib #) fatigue S/p ablation 07/2022 with Dr. Angela Adam to have paroxysms of A-fib since ablation Continue 25mg  lopressor BID Long discussion about AAD medication options, do not favor amiodarone given young age. Patient prefers to avoid tikosyn d/t inpatient  loading Will trial multaq 400mg  BID  #) Hypercoag due to A-fib CHA2DS2-VASc Score = 3 [CHF History: 0, HTN History: 1, Diabetes History: 1, Stroke History: 0, Vascular Disease History: 0, Age Score: 1, Gender Score: 0].  Therefore, the patient's annual risk of stroke is 3.2 %. OAC 5 mg Eliquis twice daily, appropriately dosed No bleeding concerns   #) SND s/p MDT Micra leadless PPM Rare V pacing Device functioning well, see Paceart for details  #) HTN Well-controlled in office today and on home readings No indication to restart lisinopril      Current medicines are reviewed at length with the patient today.   The patient does not have concerns regarding his medicines.  The following changes were made today:  none  Labs/ tests ordered today include:  Orders  Placed This Encounter  Procedures   EKG 12-Lead     Disposition: Follow up with EP APP  in 2-4 weeks after LHC  Follow-up with Dr. Lalla Brothers 12/2022 for routine 14-month post ablation appointment   Signed, [name], NP  12/06/22  10:54 AM  Electrophysiology [facility]

## 2022-12-06 ENCOUNTER — Encounter: Payer: Self-pay | Admitting: Cardiology

## 2022-12-06 ENCOUNTER — Ambulatory Visit: Payer: Federal, State, Local not specified - PPO | Attending: Cardiology | Admitting: Cardiology

## 2022-12-06 VITALS — BP 108/78 | HR 77 | Ht 71.0 in | Wt 249.4 lb

## 2022-12-06 DIAGNOSIS — D6869 Other thrombophilia: Secondary | ICD-10-CM | POA: Diagnosis not present

## 2022-12-06 DIAGNOSIS — Z950 Presence of cardiac pacemaker: Secondary | ICD-10-CM | POA: Insufficient documentation

## 2022-12-06 DIAGNOSIS — I480 Paroxysmal atrial fibrillation: Secondary | ICD-10-CM | POA: Insufficient documentation

## 2022-12-06 LAB — CUP PACEART INCLINIC DEVICE CHECK
Date Time Interrogation Session: 20240906151406
Implantable Pulse Generator Implant Date: 20230105

## 2022-12-06 MED ORDER — DRONEDARONE HCL 400 MG PO TABS: 400.0000 mg | ORAL_TABLET | Freq: Two times a day (BID) | ORAL | 3 refills | Status: DC

## 2022-12-06 NOTE — Patient Instructions (Signed)
Medication Instructions:  START Multaq 400 mg twice daily   *If you need a refill on your cardiac medications before your next appointment, please call your pharmacy*   Follow-Up: At [facility], you and your health needs are our priority.  As part of our continuing mission to provide you with exceptional heart care, we have created designated Provider Care Teams.  These Care Teams include your primary Cardiologist (physician) and Advanced Practice Providers (APPs -  Physician Assistants and Nurse Practitioners) who all work together to provide you with the care you need, when you need it.  We recommend signing up for the patient portal called "MyChart".  Sign up information is provided on this After Visit Summary.  MyChart is used to connect with patients for Virtual Visits (Telemedicine).  Patients are able to view lab/test results, encounter notes, upcoming appointments, etc.  Non-urgent messages can be sent to your provider as well.   To learn more about what you can do with MyChart, go to [email or web address].    Your next appointment:   6 week(s)  Provider:   [name], NP    Other Instructions Please reschedule Dr.Lambert appointment until sometime in November. Thank you!

## 2022-12-08 ENCOUNTER — Encounter: Payer: Self-pay | Admitting: Emergency Medicine

## 2022-12-08 ENCOUNTER — Ambulatory Visit
Admission: EM | Admit: 2022-12-08 | Discharge: 2022-12-08 | Disposition: A | Discharge status: Home / Self Care | Payer: Federal, State, Local not specified - PPO | Attending: Emergency Medicine | Admitting: Emergency Medicine

## 2022-12-08 DIAGNOSIS — U071 COVID-19: Secondary | ICD-10-CM | POA: Insufficient documentation

## 2022-12-08 LAB — SARS CORONAVIRUS 2 BY RT PCR: SARS Coronavirus 2 by RT PCR: POSITIVE — AB

## 2022-12-08 LAB — GROUP A STREP BY PCR: Group A Strep by PCR: NOT DETECTED

## 2022-12-08 MED ORDER — BENZONATATE 100 MG PO CAPS: 200.0000 mg | ORAL_CAPSULE | Freq: Three times a day (TID) | ORAL | 0 refills | Status: DC

## 2022-12-08 MED ORDER — MOLNUPIRAVIR EUA 200MG CAPSULE: 4.0000 | ORAL_CAPSULE | Freq: Two times a day (BID) | ORAL | 0 refills | Status: AC

## 2022-12-08 MED ORDER — PROMETHAZINE-DM 6.25-15 MG/5ML PO SYRP: 5.0000 mL | ORAL_SOLUTION | Freq: Four times a day (QID) | ORAL | 0 refills | Status: DC | PRN

## 2022-12-08 MED ORDER — IPRATROPIUM BROMIDE 0.06 % NA SOLN: 2.0000 | Freq: Four times a day (QID) | NASAL | 12 refills | Status: DC

## 2022-12-08 NOTE — ED Triage Notes (Signed)
Patient c/o sore throat, cough, congestion, nasal congestion that started yesterday.  Patient denies fevers.

## 2022-12-08 NOTE — Discharge Instructions (Addendum)
CDC guidelines state that you must wear a mask for the first 5 days of symptoms when you are around other people.  After 5 days you no longer need to mask as you are no longer considered infectious.  There is no longer need to quarantine unless you have a fever.  If you do have a fever then you need to quarantine until you have been fever free for 24 hours without taking Tylenol and/or ibuprofen.  Use over-the-counter Tylenol and/or ibuprofen according to the package instructions as needed for fever and pain.  Use the Atrovent nasal spray, 2 squirts up each nostril every 6 hours, as needed for nasal congestion and runny nose.  Use the Tessalon Perles every 8 hours during the day as needed for cough.  Take them with a small sip of water.  You may experience numbness to the base of your tongue or metallic taste in her mouth, this is normal.  Use the Promethazine DM cough syrup at bedtime as needed for cough and congestion.  Be mindful this medication will make you sleepy.  Take the molnupiravir twice daily for 5 days for treatment of COVID-19.  If you develop any worsening respiratory symptoms such as shortness of breath, shortness of breath at rest, feel as though you cannot catch your breath, you are unable to speak in full sentences, or, as a late sign, your lips begin turning blue you need to call 911 and go to the ER for evaluation.

## 2022-12-08 NOTE — ED Provider Notes (Addendum)
[facility]    CSN: [id number] Arrival date & time: 12/08/22  1032      History   Chief Complaint Chief Complaint  Patient presents with   Sore Throat   Cough    HPI Darius Hartter. is a 67 y.o. male.   HPI  67 year old male with past medical history significant for essential hypertension, hyperlipidemia, diabetes, and CAD presents for evaluation of 1 day with a respiratory symptoms which include nasal congestion with clear nasal discharge, sore throat, cough that is intermittently productive for yellow to clear mucus.  He denies any fever, shortness breath, or wheezing.  He is unaware of any sick contacts.  He did return 1 week ago from a conference in [place].  Past Medical History:  Diagnosis Date   Diabetes mellitus without complication (HCC)    Dysrhythmia    Hyperlipidemia    Hypertension     Patient Active Problem List   Diagnosis Date Noted   CAD in native artery 11/12/2022   Presence of leadless cardiac pacemaker 10/09/2022   Hypercoagulable state due to paroxysmal atrial fibrillation (HCC) 09/09/2022   Paroxysmal atrial fibrillation (HCC) 09/09/2022   Sick sinus syndrome (HCC) 04/05/2021   Dyspnea on exertion 03/16/2020   DDD (degenerative disc disease), lumbosacral 01/14/2018   Taking medication for chronic disease 01/08/2017   Special screening for malignant neoplasms, colon    Benign neoplasm of ascending colon    Benign neoplasm of descending colon    Familial multiple lipoprotein-type hyperlipidemia 08/23/2014   Cerebrovascular accident, impending (HCC) 08/23/2014   Temporomandibular joint disorders 08/23/2014   Arthropathy of ankle and foot 08/23/2014   Aftercare following right knee joint replacement surgery 08/23/2014   Diabetes mellitus type 2, uncontrolled 08/23/2014   Essential (primary) hypertension 08/23/2014   Adult BMI 30+ 08/23/2014   Status post left knee replacement 07/01/2014   Abnormal stress test 05/25/2014   HTN  (hypertension) 05/25/2014   Hyperlipemia 05/25/2014   Obesity 05/25/2014   Type 2 diabetes mellitus without complication (HCC) 05/25/2014   Hyperlipidemia due to type 2 diabetes mellitus (HCC) 05/25/2014   Primary osteoarthritis of left knee 05/19/2014   Primary localized osteoarthrosis, lower leg 06/30/2013    Past Surgical History:  Procedure Laterality Date   APPENDECTOMY     ATRIAL FIBRILLATION ABLATION N/A 08/12/2022   Procedure: ATRIAL FIBRILLATION ABLATION;  Surgeon: Lanier Prude, MD;  Location: [facility];  Service: Cardiovascular;  Laterality: N/A;   CARDIOVERSION N/A 09/26/2022   Procedure: CARDIOVERSION;  Surgeon: Iran Ouch, MD;  Location: [facility];  Service: Cardiovascular;  Laterality: N/A;   COLONOSCOPY  2007   repeat in 10 years   COLONOSCOPY WITH PROPOFOL N/A 06/27/2016   Procedure: COLONOSCOPY WITH PROPOFOL;  Surgeon: Midge Minium, MD;  Location: [facility];  Service: Endoscopy;  Laterality: N/A;  Diabetic - insulin and oral meds   HERNIA REPAIR     x 2   KNEE SURGERY Right    KNEE SURGERY Left    PACEMAKER LEADLESS INSERTION N/A 04/05/2021   Procedure: PACEMAKER LEADLESS INSERTION;  Surgeon: Marcina Millard, MD;  Location: [facility];  Service: Cardiovascular;  Laterality: N/A;   POLYPECTOMY  06/27/2016   Procedure: POLYPECTOMY;  Surgeon: Midge Minium, MD;  Location: [facility];  Service: Endoscopy;;   RIGHT/LEFT HEART CATH AND CORONARY ANGIOGRAPHY Bilateral 11/12/2022   Procedure: RIGHT/LEFT HEART CATH AND CORONARY ANGIOGRAPHY;  Surgeon: Yvonne Kendall, MD;  Location: [facility];  Service: Cardiovascular;  Laterality: Bilateral;   TONSILLECTOMY         Home Medications    Prior to Admission medications   Medication Sig Start Date End Date Taking? Authorizing Provider  benzonatate (TESSALON) 100 MG capsule Take 2 capsules (200 mg total) by mouth every 8 (eight) hours. 12/08/22  Yes Becky Augusta, NP   ipratropium (ATROVENT) 0.06 % nasal spray Place 2 sprays into both nostrils 4 (four) times daily. 12/08/22  Yes Becky Augusta, NP  molnupiravir EUA (LAGEVRIO) 200 mg CAPS capsule Take 4 capsules (800 mg total) by mouth 2 (two) times daily for 5 days. 12/08/22 12/13/22 Yes Becky Augusta, NP  promethazine-dextromethorphan (PROMETHAZINE-DM) 6.25-15 MG/5ML syrup Take 5 mLs by mouth 4 (four) times daily as needed. 12/08/22  Yes Becky Augusta, NP  acetaminophen (TYLENOL) 650 MG CR tablet Take 650 mg by mouth daily.    [provider]  atorvastatin (LIPITOR) 20 MG tablet Take 20 mg by mouth daily. 01/05/21   [provider]  cetirizine (ZYRTEC) 10 MG tablet Take 10 mg by mouth daily.    [provider]  cyanocobalamin (VITAMIN B12) 1000 MCG tablet Take 1,000 mcg by mouth daily.    [provider]  dronedarone (MULTAQ) 400 MG tablet Take 1 tablet (400 mg total) by mouth 2 (two) times daily with a meal. 12/06/22   [name], NP  ELIQUIS 5 MG TABS tablet TAKE 1 TABLET TWICE A DAY 10/31/22   Lanier Prude, MD  empagliflozin (JARDIANCE) 25 MG TABS tablet Take 25 mg by mouth daily. 03/08/16   [provider]  MAGNESIUM GLYCINATE PO Take by mouth. 240 mg qd    [provider]  metoprolol tartrate (LOPRESSOR) 25 MG tablet Take 1 tablet (25 mg total) by mouth 2 (two) times daily. 09/03/22   Lanier Prude, MD  Multiple Vitamins-Minerals (MENS MULTIVITAMIN PO) Take by mouth.    [provider]  Omega-3 Fatty Acids (FISH OIL) 1200 MG CAPS Take 1,200 mg by mouth in the morning and at bedtime.    [provider]  [facility] ULTRA test strip  09/21/20   [provider]  OZEMPIC, 1 MG/DOSE, 4 MG/3ML SOPN Inject 2 mg into the skin every 7 (seven) days. 12/28/21   [provider]  pioglitazone (ACTOS) 15 MG tablet Take 15 mg by mouth daily. 01/03/17   [provider]    Family History Family History  Problem Relation Age of  Onset   COPD Father     Social History Social History   Tobacco Use   Smoking status: Former    Current packs/day: 0.00    Types: Cigarettes    Quit date: 04/01/2006    Years since quitting: 16.6   Smokeless tobacco: Never  Vaping Use   Vaping status: Never Used  Substance Use Topics   Alcohol use: Not Currently    Comment: occ   Drug use: No     Allergies   Patient has no known allergies.   Review of Systems Review of Systems  Constitutional:  Negative for fever.  HENT:  Positive for congestion, rhinorrhea and sore throat. Negative for ear pain.   Respiratory:  Positive for cough. Negative for shortness of breath and wheezing.      Physical Exam Triage Vital Signs ED Triage Vitals [12/08/22 1123]  Encounter Vitals Group     BP      Systolic BP Percentile      Diastolic BP Percentile  Pulse      Resp      Temp      Temp src      SpO2      Weight 249 lb 5.4 oz (113.1 kg)     Height 5\' 11"  (1.803 m)     Head Circumference      Peak Flow      Pain Score 4     Pain Loc      Pain Education      Exclude from Growth Chart    No data found.  Updated Vital Signs BP (!) 123/95 (BP Location: Left Arm)   Pulse 67   Temp 98.2 F (36.8 C) (Oral)   Resp 15   Ht 5\' 11"  (1.803 m)   Wt 249 lb 5.4 oz (113.1 kg)   SpO2 98%   BMI 34.78 kg/m   Visual Acuity Right Eye Distance:   Left Eye Distance:   Bilateral Distance:    Right Eye Near:   Left Eye Near:    Bilateral Near:     Physical Exam Vitals and nursing note reviewed.  Constitutional:      Appearance: Normal appearance. He is not ill-appearing.  HENT:     Head: Normocephalic and atraumatic.     Right Ear: Tympanic membrane, ear canal and external ear normal. There is no impacted cerumen.     Left Ear: Tympanic membrane, ear canal and external ear normal. There is no impacted cerumen.     Nose: Congestion and rhinorrhea present.     Comments: Nasal mucosa is edematous with clear discharge in  both nares.  No erythema or injection noted.    Mouth/Throat:     Mouth: Mucous membranes are moist.     Pharynx: Oropharynx is clear. Posterior oropharyngeal erythema present. No oropharyngeal exudate.     Comments: Mild erythema to the posterior oropharynx with clear postnasal drip. Cardiovascular:     Rate and Rhythm: Normal rate and regular rhythm.     Pulses: Normal pulses.     Heart sounds: Normal heart sounds. No murmur heard.    No friction rub. No gallop.  Pulmonary:     Effort: Pulmonary effort is normal.     Breath sounds: Normal breath sounds. No wheezing, rhonchi or rales.  Musculoskeletal:     Cervical back: Normal range of motion and neck supple. No tenderness.  Lymphadenopathy:     Cervical: No cervical adenopathy.  Skin:    General: Skin is warm and dry.     Capillary Refill: Capillary refill takes less than 2 seconds.     Findings: No erythema or rash.  Neurological:     General: No focal deficit present.     Mental Status: He is alert and oriented to person, place, and time.      UC Treatments / Results  Labs (all labs ordered are listed, but only abnormal results are displayed) Labs Reviewed  SARS CORONAVIRUS 2 BY RT PCR - Abnormal; Notable for the following components:      Result Value   SARS Coronavirus 2 by RT PCR POSITIVE (*)    All other components within normal limits  GROUP A STREP BY PCR    EKG   Radiology CUP PACEART [facility] DEVICE CHECK  Result Date: 12/06/2022 Leadless check in clinic. Please see scanned/attached report.  Longevity  >8 years. VS 99.4%, VP _0.6_%. Implanted for _bradycardia__.   Procedures Procedures (including critical care time)  Medications Ordered in UC Medications -  No data to display  Initial Impression / Assessment and Plan / UC Course  I have reviewed the triage vital signs and the nursing notes.  Pertinent labs & imaging results that were available during my care of the patient were reviewed by me and  considered in my medical decision making (see chart for details).   Patient is a nontoxic-appearing 67 year old male presenting for evaluation of 1 day worth of respiratory symptoms as outlined HPI above.  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  He is unaware of any local sick contacts.  He is afebrile with an oral temp of 98.2.  Heart rate is 67 with respiration of 15.  Room air oxygen saturation is 98%.  Given that he has had a sore throat and nasal congestion, with recent travel, I will order a COVID PCR and strep PCR.  Strep PCR is negative.  COVID PCR is positive.  I will discharge patient on the diagnosis of COVID-19.  Given his comorbid risk factors I will start him on antivirals.  CMP from September 2023 shows a GFR of 83.  However, given that he is on Eliquis he will have to be placed on molnupiravir.  I will also prescribe Atrovent nasal spray, Tessalon Perles, Promethazine DM cough syrup for cough and congestion.   Final Clinical Impressions(s) / UC Diagnoses   Final diagnoses:  COVID-19     Discharge Instructions      CDC guidelines state that you must wear a mask for the first 5 days of symptoms when you are around other people.  After 5 days you no longer need to mask as you are no longer considered infectious.  There is no longer need to quarantine unless you have a fever.  If you do have a fever then you need to quarantine until you have been fever free for 24 hours without taking Tylenol and/or ibuprofen.  Use over-the-counter Tylenol and/or ibuprofen according to the package instructions as needed for fever and pain.  Use the Atrovent nasal spray, 2 squirts up each nostril every 6 hours, as needed for nasal congestion and runny nose.  Use the Tessalon Perles every 8 hours during the day as needed for cough.  Take them with a small sip of water.  You may experience numbness to the base of your tongue or metallic  taste in her mouth, this is normal.  Use the Promethazine DM cough syrup at bedtime as needed for cough and congestion.  Be mindful this medication will make you sleepy.  Take the molnupiravir twice daily for 5 days for treatment of COVID-19.  If you develop any worsening respiratory symptoms such as shortness of breath, shortness of breath at rest, feel as though you cannot catch your breath, you are unable to speak in full sentences, or, as a late sign, your lips begin turning blue you need to call 911 and go to the ER for evaluation.      ED Prescriptions     Medication Sig Dispense Auth. Provider   benzonatate (TESSALON) 100 MG capsule Take 2 capsules (200 mg total) by mouth every 8 (eight) hours. 21 capsule Becky Augusta, NP   ipratropium (ATROVENT) 0.06 % nasal spray Place 2 sprays into both nostrils 4 (four) times daily. 15 mL Becky Augusta, NP   molnupiravir EUA (LAGEVRIO) 200 mg CAPS capsule Take 4 capsules (800 mg total) by mouth 2 (two) times daily for 5  days. 40 capsule Becky Augusta, NP   promethazine-dextromethorphan (PROMETHAZINE-DM) 6.25-15 MG/5ML syrup Take 5 mLs by mouth 4 (four) times daily as needed. 118 mL Becky Augusta, NP      PDMP not reviewed this encounter.   Becky Augusta, NP 12/08/22 1203    Becky Augusta, NP 12/08/22 [phone]

## 2022-12-10 DIAGNOSIS — G4733 Obstructive sleep apnea (adult) (pediatric): Secondary | ICD-10-CM | POA: Diagnosis not present

## 2022-12-12 ENCOUNTER — Ambulatory Visit (INDEPENDENT_AMBULATORY_CARE_PROVIDER_SITE_OTHER): Payer: Federal, State, Local not specified - PPO

## 2022-12-12 DIAGNOSIS — I495 Sick sinus syndrome: Secondary | ICD-10-CM | POA: Diagnosis not present

## 2022-12-16 LAB — CUP PACEART REMOTE DEVICE CHECK
Battery Remaining Longevity: 96 mo
Battery Voltage: 3.03 V
Brady Statistic AS VP Percent: 0 %
Brady Statistic AS VS Percent: 0 %
Brady Statistic RV Percent Paced: 0.3 %
Date Time Interrogation Session: 20240916110200
Implantable Pulse Generator Implant Date: 20230105
Lead Channel Impedance Value: 550 Ohm
Lead Channel Pacing Threshold Amplitude: 0.625 V
Lead Channel Pacing Threshold Pulse Width: 0.24 ms
Lead Channel Sensing Intrinsic Amplitude: 9.9 mV
Lead Channel Setting Pacing Amplitude: 1.375
Lead Channel Setting Pacing Pulse Width: 0.24 ms
Lead Channel Setting Sensing Sensitivity: 2 mV

## 2022-12-18 ENCOUNTER — Encounter: Payer: Medicare Other | Admitting: Cardiology

## 2022-12-20 ENCOUNTER — Telehealth: Payer: Self-pay | Admitting: Cardiology

## 2022-12-20 NOTE — Telephone Encounter (Signed)
Pt c/o medication issue:  1. Name of Medication:    2. How are you currently taking this medication (dosage and times per day)? TAKE 1 TABLET TWICE A DAY   3. Are you having a reaction (difficulty breathing--STAT)? No  4. What is your medication issue? Pharmacy calling to try and refill the medication but it shows it has been discontinued. Please advise

## 2022-12-20 NOTE — Telephone Encounter (Signed)
Left a message for the pt to call back.  

## 2022-12-20 NOTE — Progress Notes (Signed)
Remote pacemaker transmission.   

## 2022-12-26 NOTE — Telephone Encounter (Signed)
Called back to [facility] and adv of recommendation to stop Flecainide. Med was never filled.    --------------------------------------------------------------------------------------------------  [name], NP  to [name]. "Buster"      11/01/22  3:57 PM Hi Mr. Lagueux --  As we discussed on the phone: Please stop the flecainide, furosemide, and lisinopril at this time.  Continue everything else.    You have an echo on Thursday, and appt with me the following Monday.  If you develop chest pain or chest pressure along with sweating, claminess, shortness of breath or feeling like you might pass out - please proceed to nearest emergency room for evaluation.      Thank you, Luella Cook, NP

## 2023-01-02 DIAGNOSIS — G4733 Obstructive sleep apnea (adult) (pediatric): Secondary | ICD-10-CM | POA: Diagnosis not present

## 2023-01-09 DIAGNOSIS — G4733 Obstructive sleep apnea (adult) (pediatric): Secondary | ICD-10-CM | POA: Diagnosis not present

## 2023-01-17 ENCOUNTER — Ambulatory Visit: Payer: Federal, State, Local not specified - PPO | Attending: Cardiology | Admitting: Cardiology

## 2023-01-17 ENCOUNTER — Encounter: Payer: Self-pay | Admitting: Cardiology

## 2023-01-17 VITALS — BP 122/84 | HR 80 | Ht 70.0 in | Wt 249.0 lb

## 2023-01-17 DIAGNOSIS — I480 Paroxysmal atrial fibrillation: Secondary | ICD-10-CM | POA: Insufficient documentation

## 2023-01-17 DIAGNOSIS — D6869 Other thrombophilia: Secondary | ICD-10-CM | POA: Diagnosis not present

## 2023-01-17 DIAGNOSIS — Z950 Presence of cardiac pacemaker: Secondary | ICD-10-CM | POA: Diagnosis not present

## 2023-01-17 DIAGNOSIS — I495 Sick sinus syndrome: Secondary | ICD-10-CM | POA: Diagnosis not present

## 2023-01-17 DIAGNOSIS — I10 Essential (primary) hypertension: Secondary | ICD-10-CM | POA: Diagnosis present

## 2023-01-17 LAB — CUP PACEART INCLINIC DEVICE CHECK
Date Time Interrogation Session: 20241018133323
Implantable Pulse Generator Implant Date: 20230105

## 2023-01-17 MED ORDER — DRONEDARONE HCL 400 MG PO TABS: 400.0000 mg | ORAL_TABLET | Freq: Two times a day (BID) | ORAL | 3 refills | Status: DC

## 2023-01-17 NOTE — Progress Notes (Signed)
Cardiology Office Note Date:  01/17/2023  Patient ID:  Darius Kelsch., DOB [birth year], MRN [id number] PCP:  Duanne Limerick, MD  Cardiologist:  None Electrophysiologist: Lanier Prude, MD    Chief Complaint: afib follow-up  History of Present Illness: Darius Brown. is a 67 y.o. male with PMH notable for tachy-brady s/p PPM, parox AFib, HTN, OSA; seen today for Lanier Prude, MD for post-procedure follow-up.  He is s/p AF ablation 07/2022 by Dr. Lalla Brothers, previous patient of [facility].  I saw him in follow-up 7/11 where he continued to have paroxysms of afib, discussed AAD and redo ablation. Redo ablation tentatively scheduled 03/17/23. We initially started flecainide, but he developed ?ST depression, hypotension during exercise with fatigue, dyspnea, dizziness. There was concern for hypovolemia, so lisinopril and lasix stopped. I saw him in clinic 8/7 where he continued to note fatigue with stable afib burden. It was recommended to undergo [facility] for further eval given ongoing symptoms with significant CAD on cardiac CT. LHC showed nonobs CAD. I again saw him in follow-up, continued to have paroxysms of afib, and started multaq.   On follow-up today, he is doing well. He remains largely asymptomatic from afib episodes though maybe has some slight increase in energy since our last appointment. He has noticed that his afib episodes are shorter - they no longer last multiple days. He does continue to have afib episodes that last several hours. 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 He was tired afterward, but not completely wiped out.  He continues to take eliquis BID, no missed doses, no bleeding concerns.  His lower leg edema is stable, has not noticed any change with stopping lasix many weeks ago.  he denies chest pain, PND, orthopnea, nausea, vomiting, dizziness, syncope, weight gain, or early satiety.     Device Information: MDT Micra,  imp 04/2021; dx tachy-brady Implanted by Dr. Darrold Junker, [facility]  AAD History: Amiodarone, stopped due to ?lack of A-fib Flecainide - briefly started, stopped d/t failed ETT with concern for significant CAD Multaq - current   Past Medical History:  Diagnosis Date   Diabetes mellitus without complication (HCC)    Dysrhythmia    Hyperlipidemia    Hypertension     Past Surgical History:  Procedure Laterality Date   APPENDECTOMY     ATRIAL FIBRILLATION ABLATION N/A 08/12/2022   Procedure: ATRIAL FIBRILLATION ABLATION;  Surgeon: Lanier Prude, MD;  [facility]: [facility];  Service: Cardiovascular;  Laterality: N/A;   CARDIOVERSION N/A 09/26/2022   Procedure: CARDIOVERSION;  Surgeon: Iran Ouch, MD;  Location: [facility] ORS;  Service: Cardiovascular;  Laterality: N/A;   COLONOSCOPY  2007   repeat in 10 years   COLONOSCOPY WITH PROPOFOL N/A 06/27/2016   Procedure: COLONOSCOPY WITH PROPOFOL;  Surgeon: Midge Minium, MD;  Location: [facility];  Service: Endoscopy;  Laterality: N/A;  Diabetic - insulin and oral meds   HERNIA REPAIR     x 2   KNEE SURGERY Right    KNEE SURGERY Left    PACEMAKER LEADLESS INSERTION N/A 04/05/2021   Procedure: PACEMAKER LEADLESS INSERTION;  Surgeon: Marcina Millard, MD;  Location: [facility] INVASIVE CV LAB;  Service: Cardiovascular;  Laterality: N/A;   POLYPECTOMY  06/27/2016   Procedure: POLYPECTOMY;  Surgeon: Midge Minium, MD;  Location: [facility];  Service: Endoscopy;;   RIGHT/LEFT HEART CATH AND CORONARY ANGIOGRAPHY Bilateral 11/12/2022   Procedure: RIGHT/LEFT HEART CATH AND  CORONARY ANGIOGRAPHY;  Surgeon: Yvonne Kendall, MD;  Location: [facility] INVASIVE CV LAB;  Service: Cardiovascular;  Laterality: Bilateral;   TONSILLECTOMY      Current Outpatient Medications  Medication Instructions   acetaminophen (TYLENOL) 650 mg, Oral, Daily   atorvastatin (LIPITOR) 20 mg, Oral, Daily   cetirizine (ZYRTEC) 10 mg, Oral, Daily   cyanocobalamin  (VITAMIN B12) 1,000 mcg, Oral, Daily   dronedarone (MULTAQ) 400 mg, Oral, 2 times daily with meals   Eliquis 5 mg, Oral, 2 times daily   empagliflozin (JARDIANCE) 25 mg, Oral, Daily   Fish Oil 1,200 mg, Oral, 2 times daily   MAGNESIUM GLYCINATE PO Oral, 240 mg qd   metoprolol tartrate (LOPRESSOR) 25 mg, Oral, 2 times daily   Multiple Vitamins-Minerals (MENS MULTIVITAMIN PO) Oral   ONETOUCH ULTRA test strip No dose, route, or frequency recorded.   Ozempic (1 MG/DOSE) 2 mg, Subcutaneous, Every 7 days   pioglitazone (ACTOS) 15 mg, Oral, Daily   promethazine-dextromethorphan (PROMETHAZINE-DM) 6.25-15 MG/5ML syrup 5 mLs, Oral, 4 times daily PRN    Social History:  The patient  reports that he quit smoking about 16 years ago. His smoking use included cigarettes. He has never used smokeless tobacco. He reports that he does not currently use alcohol. He reports that he does not use drugs.   Family History:   The patient's family history includes COPD in his father.  ROS:  Please see the history of present illness. All other systems are reviewed and otherwise negative.   PHYSICAL EXAM:  VS:  BP 122/84 (BP Location: Left Arm, Patient Position: Sitting)   Pulse 80   Ht 5\' 10"  (1.778 m)   Wt 249 lb (112.9 kg)   SpO2 97%   BMI 35.73 kg/m  BMI: Body mass index is 35.73 kg/m.  GEN- The patient is well appearing, alert and oriented x 3 today.   Lungs- Clear to ausculation bilaterally, normal work of breathing.  Heart- Irregularly irregular rate and rhythm, no murmurs, rubs or gallops Extremities- Trace peripheral edema, warm, dry Skin -leadless pacemaker maker insertion site well-healed  Device interrogation done today and reviewed by myself:  Battery >8 years Lead threshold, impedence, sensing stable  Rare V pacing No changes made today   EKG is ordered. Personal review of EKG from today shows:   EKG Interpretation Date/Time:  Friday January 17 2023 10:42:16 EDT Ventricular Rate:   80 PR Interval:    QRS Duration:  96 QT Interval:  372 QTC Calculation: 429 R Axis:   -47  Text Interpretation: Atrial fibrillation Incomplete right bundle branch block Left anterior fascicular block Confirmed by [name] [phone]) on 01/17/2023 10:48:32 AM    Recent Labs: 10/10/2022: B Natriuretic Peptide 25.5; Magnesium 2.3 11/06/2022: BUN 22; Creatinine, Ser 0.88; Platelets 297 11/12/2022: Hemoglobin 14.3; Potassium 3.8; Sodium 141  No results found for requested labs within last 365 days.   CrCl cannot be calculated (Patient's most recent lab result is older than the maximum 21 days allowed.).   Wt Readings from Last 3 Encounters:  01/17/23 249 lb (112.9 kg)  12/08/22 249 lb 5.4 oz (113.1 kg)  12/06/22 249 lb 6 oz (113.1 kg)     Additional studies reviewed include: Previous EP, cardiology notes.   R/LHC, 11/12/2022 Nonobstructive coronary artery disease with ectatic right coronary artery and focal 40% mid vessel stenosis.  No significant disease noted in the left coronary artery. Normal left heart filling pressures. Upper normal to mildly elevated right heart and pulmonary artery  pressures. Borderline low Fick cardiac output/index.  TTE, 11/07/2022  1. Left ventricular ejection fraction, by estimation, is 55 to 60%. The left ventricle has normal function. The left ventricle has no regional wall motion abnormalities. Left ventricular diastolic parameters were normal. The average left ventricular global longitudinal strain is -17.8 %. The global longitudinal strain is normal.   2. Right ventricular systolic function is low normal. The right ventricular size is mildly enlarged.   3. Left atrial size was mildly dilated.   4. The mitral valve is normal in structure. No evidence of mitral valve regurgitation.   5. The aortic valve is grossly normal. Aortic valve regurgitation is mild.   6. The inferior vena cava is normal in size with greater than 50% respiratory variability,  suggesting right atrial pressure of 3 mmHg.   ETT, 11/01/2022   down sloping ST depression (V1, V2 and V3) was noted. Exercise treadmill study, flecainide study  Resting EKG : sinus bradycardia rate 54 bpm resting blood pressure 105/74 Exercised on regular Bruce protocol  Hypotensive with exercise,  Blood pressure 79/64 at 2 minutes 50 seconds, rate 80 bpm Blood pressure 81/55 at 3 minutes 50 seconds extending into recovery Blood pressure recovered 106/82 at 1 minute in recovery rate 70 bpm No significant ST or T wave changes concerning for ischemia at peak stress or in recovery Peak heart rate 91 bpm, 59% of maximum predicted heart rate Achieved 5.5 METS, total exercise time 4 minutes   Final impression No significant ST or T wave changes or arrhythmia noted at peak stress or in recovery  CT Chest, 08/15/2022  1. Aortic root measures 4.3 cm at the sinuses of Valsalva. Recommend annual imaging followup by CTA or MRA. This recommendation follows 2010 ACCF/AHA/AATS/ACR/ASA/SCA/SCAI/SIR/STS/SVM Guidelines for the Diagnosis and Management of Patients with Thoracic Aortic Disease. Circulation. 2010; 121: U981-X914. Aortic aneurysm NOS (ICD10-I71.9) 2. Coronary artery calcification. 3. Prominent pulmonary trunk. 4. 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  CT Cardiac morph, 08/15/2022 1. There is normal pulmonary vein drainage into the left atrium.  2. The left atrial appendage is a Windsock type with ostial size 18 x 13 mm and length 38 mm, Area 16 mm2. There is no thrombus in the left atrial appendage  3. The esophagus runs in the left atrial midline and is not in the proximity to any of the pulmonary veins.  4. Coronary calcium score 500 (LAD 55.9, RCA 444). 79th percentile for age and gender matched controls.  TTE, 04/26/2020 NORMAL LEFT VENTRICULAR SYSTOLIC FUNCTION  NORMAL RIGHT VENTRICULAR SYSTOLIC FUNCTION  TRIVIAL REGURGITATION  NOTED (See above)  NO VALVULAR STENOSIS  TRIVIAL AR, MR, TR  EF 55%   ASSESSMENT AND PLAN:  #) parox AFib #) fatigue S/p ablation 07/2022 with Dr. Angela Adam to have paroxysms of A-fib since ablation, though episodes are shorter in duration since starting multaq Continue multaq 400mg  BID Continue 25mg  lopressor BID Planned for redo ablation 03/2023  #) Hypercoag due to A-fib CHA2DS2-VASc Score = 3 [CHF History: 0, HTN History: 1, Diabetes History: 1, Stroke History: 0, Vascular Disease History: 0, Age Score: 1, Gender Score: 0].  Therefore, the patient's annual risk of stroke is 3.2 %. OAC 5 mg Eliquis twice daily, appropriately dosed No bleeding concerns   #) SND s/p MDT Micra leadless PPM Rare V pacing Device functioning well, see Paceart for details  #) HTN Well-controlled in office today and on home readings  Current medicines are reviewed at length with the patient today.   The patient does not have concerns regarding his medicines.  The following changes were made today:  none  Labs/ tests ordered today include:  Orders Placed This Encounter  Procedures   EKG 12-Lead     Disposition: Follow up with Dr. Lalla Brothers  in ~6 weeks for pre-ablation appt     Signed, [name], NP  01/17/23  12:53 PM  Electrophysiology [facility]

## 2023-01-17 NOTE — Patient Instructions (Signed)
Medication Instructions:  The current medical regimen is effective;  continue present plan and medications.  *If you need a refill on your cardiac medications before your next appointment, please call your pharmacy*   Follow-Up: At [facility], you and your health needs are our priority.  As part of our continuing mission to provide you with exceptional heart care, we have created designated Provider Care Teams.  These Care Teams include your primary Cardiologist (physician) and Advanced Practice Providers (APPs -  Physician Assistants and Nurse Practitioners) who all work together to provide you with the care you need, when you need it.  We recommend signing up for the patient portal called "MyChart".  Sign up information is provided on this After Visit Summary.  MyChart is used to connect with patients for Virtual Visits (Telemedicine).  Patients are able to view lab/test results, encounter notes, upcoming appointments, etc.  Non-urgent messages can be sent to your provider as well.   To learn more about what you can do with MyChart, go to [email or web address].    Your next appointment:   Keep scheduled appointment

## 2023-01-31 DIAGNOSIS — E1159 Type 2 diabetes mellitus with other circulatory complications: Secondary | ICD-10-CM | POA: Diagnosis not present

## 2023-01-31 DIAGNOSIS — E119 Type 2 diabetes mellitus without complications: Secondary | ICD-10-CM | POA: Diagnosis not present

## 2023-01-31 DIAGNOSIS — E538 Deficiency of other specified B group vitamins: Secondary | ICD-10-CM | POA: Diagnosis not present

## 2023-01-31 DIAGNOSIS — E785 Hyperlipidemia, unspecified: Secondary | ICD-10-CM | POA: Diagnosis not present

## 2023-01-31 DIAGNOSIS — E1169 Type 2 diabetes mellitus with other specified complication: Secondary | ICD-10-CM | POA: Diagnosis not present

## 2023-01-31 DIAGNOSIS — I152 Hypertension secondary to endocrine disorders: Secondary | ICD-10-CM | POA: Diagnosis not present

## 2023-02-02 DIAGNOSIS — G4733 Obstructive sleep apnea (adult) (pediatric): Secondary | ICD-10-CM | POA: Diagnosis not present

## 2023-02-09 DIAGNOSIS — G4733 Obstructive sleep apnea (adult) (pediatric): Secondary | ICD-10-CM | POA: Diagnosis not present

## 2023-03-04 ENCOUNTER — Encounter (HOSPITAL_COMMUNITY): Payer: Self-pay

## 2023-03-04 DIAGNOSIS — G4733 Obstructive sleep apnea (adult) (pediatric): Secondary | ICD-10-CM | POA: Diagnosis not present

## 2023-03-04 NOTE — Progress Notes (Unsigned)
Electrophysiology Office Follow up Visit Note:    Date:  03/05/2023   ID:  Darius Potters., DOB March 13, 1956, MRN [id number]  PCP:  Duanne Limerick, MD  [facility] Cardiologist:  None  [facility] Electrophysiologist:  Lanier Prude, MD    Interval History:     Darius Toups. is a 66 y.o. male who presents for a follow up visit.   He had an A-fib ablation Aug 12, 2022 during which the pulmonary veins were isolated.  He saw Rosalita Chessman in follow-up January 17, 2023.  He was having symptomatic recurrence of atrial fibrillation and Multaq was started.  When he saw Rosalita Chessman for follow-up he reported shorter episodes of his atrial fibrillation although he was continuing to have them.  He takes Eliquis twice daily for stroke prophylaxis.  He has previously failed flecainide.  A redo catheter ablation is scheduled for this month.  He continues to have weekly episodes of atrial fibrillation.  There is fatigue associated with his arrhythmias.  Past medical, surgical, social and family history were reviewed.  ROS:   Please see the history of present illness.    All other systems reviewed and are negative.  EKGs/Labs/Other Studies Reviewed:    The following studies were reviewed today:  Since March 05, 2023 in clinic device interrogation personally reviewed Micra Battery longevity greater than 8 years Lead parameter stable Ventricular sensing 99.6%        Physical Exam:    VS:  BP 116/76   Pulse 62   Ht 5\' 10"  (1.778 m)   Wt 250 lb (113.4 kg)   SpO2 95%   BMI 35.87 kg/m     Wt Readings from Last 3 Encounters:  03/05/23 250 lb (113.4 kg)  01/17/23 249 lb (112.9 kg)  12/08/22 249 lb 5.4 oz (113.1 kg)     GEN: no distress CARD: RRR, No MRG RESP: No IWOB. CTAB.       ASSESSMENT:    1. Paroxysmal atrial fibrillation (HCC)   2. Primary hypertension    PLAN:    In order of problems listed above:  #Paroxysmal atrial fibrillation Continuing despite  prior ablation in May of this year.  On Multaq 400 mg by mouth twice daily.  On Lopressor.  On Eliquis for stroke prophylaxis.  I discussed treatment options including continuing Multaq, alternative antiarrhythmic drugs and redo catheter ablation.  The patient would like to proceed with redo catheter ablation which I think is very reasonable.  Discussed treatment options today for AF including antiarrhythmic drug therapy and ablation. Discussed risks, recovery and likelihood of success with each treatment strategy. Risk, benefits, and alternatives to EP study and ablation for afib were discussed. These risks include but are not limited to stroke, bleeding, vascular damage, tamponade, perforation, damage to the esophagus, lungs, phrenic nerve and other structures, pulmonary vein stenosis, worsening renal function, coronary vasospasm and death.  Discussed potential need for repeat ablation procedures and antiarrhythmic drugs after an initial ablation. The patient understands these risk and wishes to proceed.  We will therefore proceed with catheter ablation at the next available time.  Carto, ICE, anesthesia are requested for the procedure.  Will also obtain CT PV protocol prior to the procedure to exclude LAA thrombus and further evaluate atrial anatomy.  He will hold his Multaq for 5 days prior to the ablation procedure to maximize her chances of inducing any additional arrhythmias.  Transition metoprolol to Toprol-XL 25 mg by mouth twice daily   #  Sick sinus syndrome #Permanent pacemaker in situ Device functioning appropriately.  Continue remote monitoring.  #Hypertension At goal today.  Recommend checking blood pressures 1-2 times per week at home and recording the values.  Recommend bringing these recordings to the primary care physician.        Signed, Steffanie Dunn, MD, [facility], [facility] 03/05/2023 9:17 AM    Electrophysiology [facility]

## 2023-03-04 NOTE — H&P (View-Only) (Signed)
Electrophysiology Office Follow up Visit Note:    Date:  03/05/2023   ID:  Darius Brown., DOB 1956-01-18, MRN [id number]  PCP:  Duanne Limerick, MD  [facility] Cardiologist:  None  [facility] Electrophysiologist:  Lanier Prude, MD    Interval History:     Darius Brown. is a 67 y.o. male who presents for a follow up visit.   He had an A-fib ablation Aug 12, 2022 during which the pulmonary veins were isolated.  He saw Rosalita Chessman in follow-up January 17, 2023.  He was having symptomatic recurrence of atrial fibrillation and Multaq was started.  When he saw Rosalita Chessman for follow-up he reported shorter episodes of his atrial fibrillation although he was continuing to have them.  He takes Eliquis twice daily for stroke prophylaxis.  He has previously failed flecainide.  A redo catheter ablation is scheduled for this month.  He continues to have weekly episodes of atrial fibrillation.  There is fatigue associated with his arrhythmias.  Past medical, surgical, social and family history were reviewed.  ROS:   Please see the history of present illness.    All other systems reviewed and are negative.  EKGs/Labs/Other Studies Reviewed:    The following studies were reviewed today:  Since March 05, 2023 in clinic device interrogation personally reviewed Micra Battery longevity greater than 8 years Lead parameter stable Ventricular sensing 99.6%        Physical Exam:    VS:  BP 116/76   Pulse 62   Ht 5\' 10"  (1.778 m)   Wt 250 lb (113.4 kg)   SpO2 95%   BMI 35.87 kg/m     Wt Readings from Last 3 Encounters:  03/05/23 250 lb (113.4 kg)  01/17/23 249 lb (112.9 kg)  12/08/22 249 lb 5.4 oz (113.1 kg)     GEN: no distress CARD: RRR, No MRG RESP: No IWOB. CTAB.       ASSESSMENT:    1. Paroxysmal atrial fibrillation (HCC)   2. Primary hypertension    PLAN:    In order of problems listed above:  #Paroxysmal atrial fibrillation Continuing despite  prior ablation in May of this year.  On Multaq 400 mg by mouth twice daily.  On Lopressor.  On Eliquis for stroke prophylaxis.  I discussed treatment options including continuing Multaq, alternative antiarrhythmic drugs and redo catheter ablation.  The patient would like to proceed with redo catheter ablation which I think is very reasonable.  Discussed treatment options today for AF including antiarrhythmic drug therapy and ablation. Discussed risks, recovery and likelihood of success with each treatment strategy. Risk, benefits, and alternatives to EP study and ablation for afib were discussed. These risks include but are not limited to stroke, bleeding, vascular damage, tamponade, perforation, damage to the esophagus, lungs, phrenic nerve and other structures, pulmonary vein stenosis, worsening renal function, coronary vasospasm and death.  Discussed potential need for repeat ablation procedures and antiarrhythmic drugs after an initial ablation. The patient understands these risk and wishes to proceed.  We will therefore proceed with catheter ablation at the next available time.  Carto, ICE, anesthesia are requested for the procedure.  Will also obtain CT PV protocol prior to the procedure to exclude LAA thrombus and further evaluate atrial anatomy.  He will hold his Multaq for 5 days prior to the ablation procedure to maximize her chances of inducing any additional arrhythmias.  Transition metoprolol to Toprol-XL 25 mg by mouth twice daily   #  Sick sinus syndrome #Permanent pacemaker in situ Device functioning appropriately.  Continue remote monitoring.  #Hypertension At goal today.  Recommend checking blood pressures 1-2 times per week at home and recording the values.  Recommend bringing these recordings to the primary care physician.        Signed, Steffanie Dunn, MD, [facility], [facility] 03/05/2023 9:17 AM    Electrophysiology [facility]

## 2023-03-05 ENCOUNTER — Ambulatory Visit: Payer: Federal, State, Local not specified - PPO | Attending: Cardiology | Admitting: Cardiology

## 2023-03-05 ENCOUNTER — Encounter: Payer: Self-pay | Admitting: Cardiology

## 2023-03-05 VITALS — BP 116/76 | HR 62 | Ht 70.0 in | Wt 250.0 lb

## 2023-03-05 DIAGNOSIS — I10 Essential (primary) hypertension: Secondary | ICD-10-CM | POA: Diagnosis not present

## 2023-03-05 DIAGNOSIS — I480 Paroxysmal atrial fibrillation: Secondary | ICD-10-CM

## 2023-03-05 MED ORDER — METOPROLOL SUCCINATE ER 25 MG PO TB24: 25.0000 mg | ORAL_TABLET | Freq: Two times a day (BID) | ORAL | 3 refills | Status: DC

## 2023-03-05 NOTE — Patient Instructions (Signed)
Medication Instructions:  Your physician has recommended you make the following change in your medication:  1) STOP taking metoprolol tartrate (Lopressor) 2) START taking metoprolol succinate (Toprol XL) 25 mg twice daily   *If you need a refill on your cardiac medications before your next appointment, please call your pharmacy*   Lab Work: TODAY: BMET and CBC  If you have labs (blood work) drawn today and your tests are completely normal, you will receive your results only by: MyChart Message (if you have MyChart) OR A paper copy in the mail If you have any lab test that is abnormal or we need to change your treatment, we will call you to review the results.  Follow-Up: At [facility], you and your health needs are our priority.  As part of our continuing mission to provide you with exceptional heart care, we have created designated Provider Care Teams.  These Care Teams include your primary Cardiologist (physician) and Advanced Practice Providers (APPs -  Physician Assistants and Nurse Practitioners) who all work together to provide you with the care you need, when you need it.  Your next appointment:   4 weeks after your ablation with the AFIB clinic  90 days after your ablation with Dr. Lalla Brothers or APP

## 2023-03-06 ENCOUNTER — Ambulatory Visit
Admission: RE | Admit: 2023-03-06 | Discharge: 2023-03-06 | Disposition: A | Discharge status: Home / Self Care | Payer: Federal, State, Local not specified - PPO | Source: Ambulatory Visit | Attending: Cardiology | Admitting: Cardiology

## 2023-03-06 ENCOUNTER — Other Ambulatory Visit (HOSPITAL_COMMUNITY): Payer: Medicare Other

## 2023-03-06 DIAGNOSIS — I480 Paroxysmal atrial fibrillation: Secondary | ICD-10-CM | POA: Diagnosis not present

## 2023-03-06 LAB — BASIC METABOLIC PANEL
BUN/Creatinine Ratio: 21 (ref 10–24)
BUN: 22 mg/dL (ref 8–27)
CO2: 23 mmol/L (ref 20–29)
Calcium: 9.2 mg/dL (ref 8.6–10.2)
Chloride: 105 mmol/L (ref 96–106)
Creatinine, Ser: 1.03 mg/dL (ref 0.76–1.27)
Glucose: 133 mg/dL — ABNORMAL HIGH (ref 70–99)
Potassium: 4.8 mmol/L (ref 3.5–5.2)
Sodium: 142 mmol/L (ref 134–144)
eGFR: 80 mL/min/{1.73_m2} (ref >59)

## 2023-03-06 LAB — CBC
Hematocrit: 46.9 % (ref 37.5–51.0)
Hemoglobin: 15.6 g/dL (ref 13.0–17.7)
MCH: 32.9 pg (ref 26.6–33.0)
MCHC: 33.3 g/dL (ref 31.5–35.7)
MCV: 99 fL — ABNORMAL HIGH (ref 79–97)
Platelets: 296 10*3/uL (ref 150–450)
RBC: 4.74 x10E6/uL (ref 4.14–5.80)
RDW: 11.9 % (ref 11.6–15.4)
WBC: 6.4 10*3/uL (ref 3.4–10.8)

## 2023-03-06 MED ORDER — SODIUM CHLORIDE 0.9 % IV BOLUS
125.0000 mL | Freq: Once | INTRAVENOUS | Status: AC
  Administered 2023-03-06: 125 mL via INTRAVENOUS

## 2023-03-06 MED ORDER — IOHEXOL 350 MG/ML SOLN
75.0000 mL | Freq: Once | INTRAVENOUS | Status: AC | PRN
Start: 2023-03-06 — End: 2023-03-06
  Administered 2023-03-06: 100 mL via INTRAVENOUS

## 2023-03-09 NOTE — Pre-Procedure Instructions (Signed)
Patient is scheduled for procedure Dec 16 with anesthesia.  Anesthesia requires Ozempic to be held for 7 day.  Left voicemail that his last dose could be today, don't take any after today until after the procedure.

## 2023-03-13 ENCOUNTER — Ambulatory Visit (INDEPENDENT_AMBULATORY_CARE_PROVIDER_SITE_OTHER): Payer: Federal, State, Local not specified - PPO

## 2023-03-13 DIAGNOSIS — I495 Sick sinus syndrome: Secondary | ICD-10-CM

## 2023-03-15 LAB — CUP PACEART INCLINIC DEVICE CHECK
Date Time Interrogation Session: 20241204131145
Implantable Pulse Generator Implant Date: 20230105

## 2023-03-16 NOTE — Anesthesia Preprocedure Evaluation (Signed)
Anesthesia Evaluation  Patient identified by MRN, date of birth, ID band Patient awake    Reviewed: Allergy & Precautions, H&P , NPO status , Patient's Chart, lab work & pertinent test results  Airway Mallampati: II  TM Distance: >3 FB Neck ROM: Full    Dental no notable dental hx. (+) Teeth Intact, Dental Advisory Given   Pulmonary neg pulmonary ROS, former smoker   Pulmonary exam normal breath sounds clear to auscultation       Cardiovascular Exercise Tolerance: Good hypertension, Pt. on medications and Pt. on home beta blockers + dysrhythmias Atrial Fibrillation  Rhythm:Irregular Rate:Normal     Neuro/Psych negative neurological ROS  negative psych ROS   GI/Hepatic negative GI ROS, Neg liver ROS,,,  Endo/Other  diabetes, Type 2, Oral Hypoglycemic Agents  Class 3 obesity  Renal/GU negative Renal ROS  negative genitourinary   Musculoskeletal  (+) Arthritis , Osteoarthritis,    Abdominal   Peds  Hematology negative hematology ROS (+)   Anesthesia Other Findings   Reproductive/Obstetrics negative OB ROS                             Anesthesia Physical Anesthesia Plan  ASA: 3  Anesthesia Plan: General   Post-op Pain Management: Tylenol PO (pre-op)*   Induction: Intravenous  PONV Risk Score and Plan: 3 and Ondansetron and Dexamethasone  Airway Management Planned: Oral ETT  Additional Equipment:   Intra-op Plan:   Post-operative Plan: Extubation in OR  Informed Consent: I have reviewed the patients History and Physical, chart, labs and discussed the procedure including the risks, benefits and alternatives for the proposed anesthesia with the patient or authorized representative who has indicated his/her understanding and acceptance.     Dental advisory given  Plan Discussed with: CRNA  Anesthesia Plan Comments:        Anesthesia Quick Evaluation

## 2023-03-17 ENCOUNTER — Other Ambulatory Visit (HOSPITAL_COMMUNITY): Payer: Self-pay

## 2023-03-17 ENCOUNTER — Ambulatory Visit (HOSPITAL_COMMUNITY): Payer: Federal, State, Local not specified - PPO | Admitting: Anesthesiology

## 2023-03-17 ENCOUNTER — Ambulatory Visit (HOSPITAL_COMMUNITY)
Admission: RE | Disposition: A | Discharge status: Home / Self Care | Payer: Medicare Other | Source: Home / Self Care | Attending: Cardiology

## 2023-03-17 ENCOUNTER — Ambulatory Visit (HOSPITAL_COMMUNITY)
Admission: RE | Admit: 2023-03-17 | Discharge: 2023-03-17 | Disposition: A | Discharge status: Home / Self Care | Payer: Federal, State, Local not specified - PPO | Attending: Cardiology | Admitting: Cardiology

## 2023-03-17 ENCOUNTER — Other Ambulatory Visit: Payer: Self-pay

## 2023-03-17 DIAGNOSIS — I495 Sick sinus syndrome: Secondary | ICD-10-CM | POA: Diagnosis not present

## 2023-03-17 DIAGNOSIS — I480 Paroxysmal atrial fibrillation: Secondary | ICD-10-CM | POA: Insufficient documentation

## 2023-03-17 DIAGNOSIS — Z79899 Other long term (current) drug therapy: Secondary | ICD-10-CM | POA: Diagnosis not present

## 2023-03-17 DIAGNOSIS — Z87891 Personal history of nicotine dependence: Secondary | ICD-10-CM | POA: Insufficient documentation

## 2023-03-17 DIAGNOSIS — Z7984 Long term (current) use of oral hypoglycemic drugs: Secondary | ICD-10-CM | POA: Insufficient documentation

## 2023-03-17 DIAGNOSIS — Z950 Presence of cardiac pacemaker: Secondary | ICD-10-CM | POA: Insufficient documentation

## 2023-03-17 DIAGNOSIS — I4891 Unspecified atrial fibrillation: Secondary | ICD-10-CM | POA: Diagnosis not present

## 2023-03-17 DIAGNOSIS — E119 Type 2 diabetes mellitus without complications: Secondary | ICD-10-CM | POA: Diagnosis not present

## 2023-03-17 DIAGNOSIS — I2510 Atherosclerotic heart disease of native coronary artery without angina pectoris: Secondary | ICD-10-CM | POA: Diagnosis not present

## 2023-03-17 DIAGNOSIS — I10 Essential (primary) hypertension: Secondary | ICD-10-CM | POA: Insufficient documentation

## 2023-03-17 HISTORY — PX: ATRIAL FIBRILLATION ABLATION: EP1191

## 2023-03-17 LAB — POCT ACTIVATED CLOTTING TIME: Activated Clotting Time: 314 s

## 2023-03-17 LAB — GLUCOSE, CAPILLARY
Glucose-Capillary: 142 mg/dL — ABNORMAL HIGH (ref 70–99)
Glucose-Capillary: 161 mg/dL — ABNORMAL HIGH (ref 70–99)
Glucose-Capillary: 174 mg/dL — ABNORMAL HIGH (ref 70–99)

## 2023-03-17 SURGERY — ATRIAL FIBRILLATION ABLATION
Anesthesia: General

## 2023-03-17 MED ORDER — COLCHICINE 0.6 MG PO TABS
0.6000 mg | ORAL_TABLET | Freq: Two times a day (BID) | ORAL | Status: DC
  Administered 2023-03-17: 0.6 mg via ORAL
  Filled 2023-03-17 (×2): qty 1

## 2023-03-17 MED ORDER — EPHEDRINE SULFATE-NACL 50-0.9 MG/10ML-% IV SOSY
PREFILLED_SYRINGE | INTRAVENOUS | Status: DC | PRN
  Administered 2023-03-17: 10 mg via INTRAVENOUS

## 2023-03-17 MED ORDER — ONDANSETRON HCL 4 MG/2ML IJ SOLN
4.0000 mg | Freq: Four times a day (QID) | INTRAMUSCULAR | Status: DC | PRN
Start: 2023-03-17 — End: 2023-03-17

## 2023-03-17 MED ORDER — SODIUM CHLORIDE 0.9% FLUSH: 3.0000 mL | INTRAVENOUS | Status: DC | PRN

## 2023-03-17 MED ORDER — ATROPINE SULFATE 1 MG/10ML IJ SOSY
PREFILLED_SYRINGE | INTRAMUSCULAR | Status: AC
  Filled 2023-03-17: qty 10

## 2023-03-17 MED ORDER — SODIUM CHLORIDE 0.9 % IV SOLN: 250.0000 mL | INTRAVENOUS | Status: DC | PRN

## 2023-03-17 MED ORDER — SUGAMMADEX SODIUM 200 MG/2ML IV SOLN
INTRAVENOUS | Status: DC | PRN
  Administered 2023-03-17: 200 mg via INTRAVENOUS

## 2023-03-17 MED ORDER — ACETAMINOPHEN 325 MG PO TABS: 650.0000 mg | ORAL_TABLET | ORAL | Status: DC | PRN

## 2023-03-17 MED ORDER — FENTANYL CITRATE (PF) 250 MCG/5ML IJ SOLN
INTRAMUSCULAR | Status: DC | PRN
  Administered 2023-03-17: 100 ug via INTRAVENOUS

## 2023-03-17 MED ORDER — ONDANSETRON HCL 4 MG/2ML IJ SOLN
INTRAMUSCULAR | Status: DC | PRN
  Administered 2023-03-17: 4 mg via INTRAVENOUS

## 2023-03-17 MED ORDER — PANTOPRAZOLE SODIUM 40 MG PO TBEC
40.0000 mg | DELAYED_RELEASE_TABLET | Freq: Every day | ORAL | 0 refills | Status: DC
  Filled 2023-03-17: qty 45, 45d supply, fill #0

## 2023-03-17 MED ORDER — PHENYLEPHRINE HCL-NACL 20-0.9 MG/250ML-% IV SOLN
INTRAVENOUS | Status: DC | PRN
  Administered 2023-03-17: 30 ug/min via INTRAVENOUS

## 2023-03-17 MED ORDER — PROTAMINE SULFATE 10 MG/ML IV SOLN
INTRAVENOUS | Status: DC | PRN
  Administered 2023-03-17: 25 mg via INTRAVENOUS
  Administered 2023-03-17: 10 mg via INTRAVENOUS

## 2023-03-17 MED ORDER — DEXAMETHASONE SODIUM PHOSPHATE 10 MG/ML IJ SOLN
INTRAMUSCULAR | Status: DC | PRN
  Administered 2023-03-17: 5 mg via INTRAVENOUS

## 2023-03-17 MED ORDER — HEPARIN SODIUM (PORCINE) 1000 UNIT/ML IJ SOLN
INTRAMUSCULAR | Status: DC | PRN
  Administered 2023-03-17: 4000 [IU] via INTRAVENOUS
  Administered 2023-03-17: 17000 [IU] via INTRAVENOUS

## 2023-03-17 MED ORDER — PANTOPRAZOLE SODIUM 40 MG PO TBEC
40.0000 mg | DELAYED_RELEASE_TABLET | Freq: Every day | ORAL | Status: DC
  Administered 2023-03-17: 40 mg via ORAL
  Filled 2023-03-17 (×2): qty 1

## 2023-03-17 MED ORDER — HEPARIN (PORCINE) IN NACL 1000-0.9 UT/500ML-% IV SOLN
INTRAVENOUS | Status: DC | PRN
  Administered 2023-03-17 (×3): 500 mL

## 2023-03-17 MED ORDER — PROPOFOL 10 MG/ML IV BOLUS
INTRAVENOUS | Status: DC | PRN
  Administered 2023-03-17: 150 mg via INTRAVENOUS

## 2023-03-17 MED ORDER — APIXABAN 5 MG PO TABS
5.0000 mg | ORAL_TABLET | Freq: Two times a day (BID) | ORAL | Status: DC
  Administered 2023-03-17: 5 mg via ORAL
  Filled 2023-03-17: qty 1

## 2023-03-17 MED ORDER — CEFAZOLIN SODIUM-DEXTROSE 2-3 GM-%(50ML) IV SOLR
INTRAVENOUS | Status: DC | PRN
  Administered 2023-03-17: 2 g via INTRAVENOUS

## 2023-03-17 MED ORDER — MIDAZOLAM HCL 2 MG/2ML IJ SOLN
INTRAMUSCULAR | Status: DC | PRN
  Administered 2023-03-17: 2 mg via INTRAVENOUS

## 2023-03-17 MED ORDER — COLCHICINE 0.6 MG PO TABS
0.6000 mg | ORAL_TABLET | Freq: Two times a day (BID) | ORAL | 0 refills | Status: DC
  Filled 2023-03-17: qty 10, 5d supply, fill #0

## 2023-03-17 MED ORDER — ACETAMINOPHEN 500 MG PO TABS
1000.0000 mg | ORAL_TABLET | Freq: Once | ORAL | Status: AC
  Administered 2023-03-17: 1000 mg via ORAL
  Filled 2023-03-17: qty 2

## 2023-03-17 MED ORDER — CEFAZOLIN SODIUM-DEXTROSE 2-4 GM/100ML-% IV SOLN
INTRAVENOUS | Status: AC
  Filled 2023-03-17: qty 100

## 2023-03-17 MED ORDER — ATROPINE SULFATE 1 MG/10ML IJ SOSY
PREFILLED_SYRINGE | INTRAMUSCULAR | Status: DC | PRN
  Administered 2023-03-17: .5 mg via INTRAVENOUS

## 2023-03-17 MED ORDER — SODIUM CHLORIDE 0.9 % IV SOLN: INTRAVENOUS | Status: DC

## 2023-03-17 MED ORDER — LIDOCAINE 2% (20 MG/ML) 5 ML SYRINGE
INTRAMUSCULAR | Status: DC | PRN
  Administered 2023-03-17: 60 mg via INTRAVENOUS

## 2023-03-17 MED ORDER — SODIUM CHLORIDE 0.9% FLUSH: 3.0000 mL | Freq: Two times a day (BID) | INTRAVENOUS | Status: DC

## 2023-03-17 MED ORDER — ROCURONIUM BROMIDE 10 MG/ML (PF) SYRINGE
PREFILLED_SYRINGE | INTRAVENOUS | Status: DC | PRN
  Administered 2023-03-17: 20 mg via INTRAVENOUS
  Administered 2023-03-17: 60 mg via INTRAVENOUS

## 2023-03-17 SURGICAL SUPPLY — 20 items
BAG SNAP BAND KOVER 36X36 (MISCELLANEOUS) IMPLANT
BLANKET WARM UNDERBOD FULL ACC (MISCELLANEOUS) ×1 IMPLANT
CABLE PFA RX CATH CONN (CABLE) IMPLANT
CATH FARAWAVE ABLATION 31 (CATHETERS) IMPLANT
CATH OCTARAY 2.0 F 3-3-3-3-3 (CATHETERS) IMPLANT
CATH SOUNDSTAR ECO 8FR (CATHETERS) IMPLANT
CATH WEBSTER BI DIR CS D-F CRV (CATHETERS) IMPLANT
CLOSURE PERCLOSE PROSTYLE (VASCULAR PRODUCTS) IMPLANT
COVER SWIFTLINK CONNECTOR (BAG) ×1 IMPLANT
DILATOR VESSEL 38 20CM 16FR (INTRODUCER) IMPLANT
GUIDEWIRE INQWIRE 1.5J.035X260 (WIRE) IMPLANT
INQWIRE 1.5J .035X260CM (WIRE) ×1 IMPLANT
KIT VERSACROSS CNCT FARADRIVE (KITS) IMPLANT
PACK EP LF (CUSTOM PROCEDURE TRAY) ×1 IMPLANT
PAD DEFIB RADIO PHYSIO CONN (PAD) ×1 IMPLANT
PATCH CARTO3 (PAD) IMPLANT
SHEATH FARADRIVE STEERABLE (SHEATH) IMPLANT
SHEATH PINNACLE 8F 10CM (SHEATH) IMPLANT
SHEATH PINNACLE 9F 10CM (SHEATH) IMPLANT
SHEATH PROBE COVER 6X72 (BAG) IMPLANT

## 2023-03-17 NOTE — Anesthesia Postprocedure Evaluation (Signed)
Anesthesia Post Note  Patient: Darius Brown.  Procedure(s) Performed: ATRIAL FIBRILLATION ABLATION     Patient location during evaluation: Cath Lab Anesthesia Type: General Level of consciousness: awake and alert Pain management: pain level controlled Vital Signs Assessment: post-procedure vital signs reviewed and stable Respiratory status: spontaneous breathing, nonlabored ventilation and respiratory function stable Cardiovascular status: blood pressure returned to baseline and stable Postop Assessment: no apparent nausea or vomiting Anesthetic complications: no  No notable events documented.  Last Vitals:  Vitals:   03/17/23 1000 03/17/23 1015  BP: 125/82 116/75  Pulse: 62 (!) 59  Resp: 12 16  Temp: (!) 36.2 C   SpO2: 95% 94%    Last Pain:  Vitals:   03/17/23 1015  TempSrc:   PainSc: 0-No pain                 Dencil Cayson,W. EDMOND

## 2023-03-17 NOTE — Transfer of Care (Signed)
Immediate Anesthesia Transfer of Care Note  Patient: Roley Ode.  Procedure(s) Performed: ATRIAL FIBRILLATION ABLATION  Patient Location: Cath Lab  Anesthesia Type:General  Level of Consciousness: drowsy and patient cooperative  Airway & Oxygen Therapy: Patient Spontanous Breathing and Patient connected to nasal cannula oxygen  Post-op Assessment: Report given to RN and Post -op Vital signs reviewed and stable  Post vital signs: Reviewed and stable  Last Vitals:  Vitals Value Taken Time  BP    Temp    Pulse 63 03/17/23 0930  Resp 19 03/17/23 0930  SpO2 97 % 03/17/23 0930  Vitals shown include unfiled device data.  Last Pain:  Vitals:   03/17/23 0633  TempSrc:   PainSc: 0-No pain      Patients Stated Pain Goal: 4 (03/17/23 1610)  Complications: No notable events documented.

## 2023-03-17 NOTE — Discharge Instructions (Signed)

## 2023-03-17 NOTE — Progress Notes (Signed)
Patient is oozing from his right femoral site after ambulation. RN help pressure. Q30 check ,sites look intact. Patient ambulated again and there were no complications. Patient is ready for DC

## 2023-03-17 NOTE — Anesthesia Procedure Notes (Signed)
Procedure Name: Intubation Date/Time: 03/17/2023 7:44 AM  Performed by: Gus Puma, CRNAPre-anesthesia Checklist: Patient identified, Emergency Drugs available, Suction available and Patient being monitored Patient Re-evaluated:Patient Re-evaluated prior to induction Oxygen Delivery Method: Circle System Utilized Preoxygenation: Pre-oxygenation with 100% oxygen Induction Type: IV induction Ventilation: Mask ventilation without difficulty Laryngoscope Size: Mac and 4 Tube type: Oral Tube size: 7.5 mm Number of attempts: 1 Airway Equipment and Method: Stylet and Oral airway Placement Confirmation: ETT inserted through vocal cords under direct vision, positive ETCO2 and breath sounds checked- equal and bilateral Secured at: 23 cm Tube secured with: Tape Dental Injury: Teeth and Oropharynx as per pre-operative assessment

## 2023-03-17 NOTE — Interval H&P Note (Signed)
History and Physical Interval Note:  03/17/2023 6:44 AM  Darius Brown.  has presented today for surgery, with the diagnosis of afib.  The various methods of treatment have been discussed with the patient and family. After consideration of risks, benefits and other options for treatment, the patient has consented to  Procedure(s): ATRIAL FIBRILLATION ABLATION (N/A) as a surgical intervention.  The patient's history has been reviewed, patient examined, no change in status, stable for surgery.  I have reviewed the patient's chart and labs.  Questions were answered to the patient's satisfaction.     Yousra Ivens T Lizanne Erker

## 2023-03-18 ENCOUNTER — Encounter (HOSPITAL_COMMUNITY): Payer: Self-pay | Admitting: Cardiology

## 2023-03-18 MED FILL — Cefazolin Sodium-Dextrose IV Solution 2 GM/100ML-4%: INTRAVENOUS | Qty: 100 | Status: AC

## 2023-03-19 LAB — CUP PACEART REMOTE DEVICE CHECK
Battery Remaining Longevity: 96 mo
Battery Voltage: 3.03 V
Brady Statistic AS VP Percent: 0 %
Brady Statistic AS VS Percent: 0 %
Brady Statistic RV Percent Paced: 0.37 %
Date Time Interrogation Session: 20241217133100
Implantable Pulse Generator Implant Date: 20230105
Lead Channel Impedance Value: 550 Ohm
Lead Channel Pacing Threshold Amplitude: 0.625 V
Lead Channel Pacing Threshold Pulse Width: 0.24 ms
Lead Channel Sensing Intrinsic Amplitude: 10.8 mV
Lead Channel Setting Pacing Amplitude: 1.25 V
Lead Channel Setting Pacing Pulse Width: 0.24 ms
Lead Channel Setting Sensing Sensitivity: 2 mV

## 2023-03-21 ENCOUNTER — Ambulatory Visit
Admission: EM | Admit: 2023-03-21 | Discharge: 2023-03-21 | Disposition: A | Discharge status: Home / Self Care | Payer: Federal, State, Local not specified - PPO | Attending: Emergency Medicine | Admitting: Emergency Medicine

## 2023-03-21 DIAGNOSIS — Z20822 Contact with and (suspected) exposure to covid-19: Secondary | ICD-10-CM | POA: Insufficient documentation

## 2023-03-21 DIAGNOSIS — J329 Chronic sinusitis, unspecified: Secondary | ICD-10-CM | POA: Insufficient documentation

## 2023-03-21 DIAGNOSIS — B9789 Other viral agents as the cause of diseases classified elsewhere: Secondary | ICD-10-CM | POA: Diagnosis not present

## 2023-03-21 DIAGNOSIS — R197 Diarrhea, unspecified: Secondary | ICD-10-CM | POA: Diagnosis not present

## 2023-03-21 DIAGNOSIS — J069 Acute upper respiratory infection, unspecified: Secondary | ICD-10-CM | POA: Diagnosis not present

## 2023-03-21 LAB — RESP PANEL BY RT-PCR (FLU A&B, COVID) ARPGX2
Influenza A by PCR: NEGATIVE
Influenza B by PCR: NEGATIVE
SARS Coronavirus 2 by RT PCR: NEGATIVE

## 2023-03-21 MED ORDER — HYDROCOD POLI-CHLORPHE POLI ER 10-8 MG/5ML PO SUER: 5.0000 mL | Freq: Two times a day (BID) | ORAL | 0 refills | Status: DC | PRN

## 2023-03-21 MED ORDER — IPRATROPIUM BROMIDE 0.06 % NA SOLN: 2.0000 | Freq: Four times a day (QID) | NASAL | 0 refills | Status: DC

## 2023-03-21 NOTE — ED Provider Notes (Signed)
HPI  SUBJECTIVE:  Darius Brown. is a 67 y.o. male who presents with nasal congestion, clear rhinorrhea, sinus pain and pressure, postnasal drip, cough productive of white sputum, sore throat from postnasal drip starting yesterday morning.  He had 4 episodes of watery, nonbloody diarrhea yesterday, but states that he usually gets diarrhea after taking his dose of Ozempic.  He restarted his Ozempic 4 days ago.  No fevers, body aches, facial swelling, upper dental pain, hemoptysis, wheezing, chest pain, shortness of breath, dyspnea on exertion, nausea, vomiting, abdominal pain, change in urine output.  He is tolerating p.o.  He states that the cough causes diarrhea.  He is unable to sleep at night because of the cough.  No known COVID or flu exposure.  He got the COVID and flu vaccines.   He tried a bland diet, Pepto-Bismol, Imodium, Flonase, and Promethazine DM, Tylenol, leftover cough drops.  The promethazine helps with the cough.  No other aggravating factors.  No antipyretic in the past 6 hours.  No antibiotics in the past month. Patient has a past medical history of uncontrolled diabetes, atrial fibrillation on Eliquis, sick sinus syndrome with pacemaker, hyperlipidemia, hypertension, CVA, coronary artery disease, OSA.  He is status post cardiac ablation on 12/16.  Past Medical History:  Diagnosis Date   Diabetes mellitus without complication (HCC)    Dysrhythmia    Hyperlipidemia    Hypertension     Past Surgical History:  Procedure Laterality Date   APPENDECTOMY     ATRIAL FIBRILLATION ABLATION N/A 08/12/2022   Procedure: ATRIAL FIBRILLATION ABLATION;  Surgeon: Lanier Prude, MD;  Location: [facility];  Service: Cardiovascular;  Laterality: N/A;   ATRIAL FIBRILLATION ABLATION N/A 03/17/2023   Procedure: ATRIAL FIBRILLATION ABLATION;  Surgeon: Lanier Prude, MD;  Location: [facility];  Service: Cardiovascular;  Laterality: N/A;   CARDIOVERSION N/A 09/26/2022    Procedure: CARDIOVERSION;  Surgeon: Iran Ouch, MD;  Location: [facility];  Service: Cardiovascular;  Laterality: N/A;   COLONOSCOPY  2007   repeat in 10 years   COLONOSCOPY WITH PROPOFOL N/A 06/27/2016   Procedure: COLONOSCOPY WITH PROPOFOL;  Surgeon: Midge Minium, MD;  Location: [facility];  Service: Endoscopy;  Laterality: N/A;  Diabetic - insulin and oral meds   HERNIA REPAIR     x 2   KNEE SURGERY Right    KNEE SURGERY Left    PACEMAKER LEADLESS INSERTION N/A 04/05/2021   Procedure: PACEMAKER LEADLESS INSERTION;  Surgeon: Marcina Millard, MD;  Location: [facility];  Service: Cardiovascular;  Laterality: N/A;   POLYPECTOMY  06/27/2016   Procedure: POLYPECTOMY;  Surgeon: Midge Minium, MD;  Location: [facility];  Service: Endoscopy;;   RIGHT/LEFT HEART CATH AND CORONARY ANGIOGRAPHY Bilateral 11/12/2022   Procedure: RIGHT/LEFT HEART CATH AND CORONARY ANGIOGRAPHY;  Surgeon: Yvonne Kendall, MD;  Location: [facility];  Service: Cardiovascular;  Laterality: Bilateral;   TONSILLECTOMY      Family History  Problem Relation Age of Onset   COPD Father     Social History   Tobacco Use   Smoking status: Former    Current packs/day: 0.00    Types: Cigarettes    Quit date: 04/01/2006    Years since quitting: 16.9   Smokeless tobacco: Never  Vaping Use   Vaping status: Never Used  Substance Use Topics   Alcohol use: Not Currently    Comment: occ   Drug use: No    No  current facility-administered medications for this encounter.  Current Outpatient Medications:    acetaminophen (TYLENOL) 650 MG CR tablet, Take 650 mg by mouth daily., Disp: , Rfl:    atorvastatin (LIPITOR) 20 MG tablet, Take 20 mg by mouth daily., Disp: , Rfl:    cetirizine (ZYRTEC) 10 MG tablet, Take 10 mg by mouth daily., Disp: , Rfl:    chlorpheniramine-HYDROcodone (TUSSIONEX) 10-8 MG/5ML, Take 5 mLs by mouth every 12 (twelve) hours as needed for cough., Disp: 60 mL, Rfl: 0    colchicine 0.6 MG tablet, Take 1 tablet (0.6 mg total) by mouth 2 (two) times daily for 5 days. Post procedure, you do not need refills., Disp: 10 tablet, Rfl: 0   dronedarone (MULTAQ) 400 MG tablet, Take 1 tablet (400 mg total) by mouth 2 (two) times daily with a meal., Disp: 60 tablet, Rfl: 3   ELIQUIS 5 MG TABS tablet, TAKE 1 TABLET TWICE A DAY, Disp: 180 tablet, Rfl: 1   empagliflozin (JARDIANCE) 25 MG TABS tablet, Take 25 mg by mouth daily., Disp: , Rfl:    ipratropium (ATROVENT) 0.06 % nasal spray, Place 2 sprays into both nostrils 4 (four) times daily., Disp: 15 mL, Rfl: 0   magnesium oxide (MAG-OX) 400 (240 Mg) MG tablet, Take 400 mg by mouth daily., Disp: , Rfl:    Multiple Vitamins-Minerals (MENS MULTIVITAMIN PO), Take 1 tablet by mouth daily., Disp: , Rfl:    Omega-3 Fatty Acids (FISH OIL) 1200 MG CAPS, Take 1,200 mg by mouth in the morning and at bedtime., Disp: , Rfl:    ONETOUCH ULTRA test strip, , Disp: , Rfl:    OZEMPIC, 1 MG/DOSE, 4 MG/3ML SOPN, Inject 2 mg into the skin every Sunday., Disp: , Rfl:    pantoprazole (PROTONIX) 40 MG tablet, Take 1 tablet (40 mg total) by mouth daily. Post procedure, you do not need refills., Disp: 45 tablet, Rfl: 0   pioglitazone (ACTOS) 15 MG tablet, Take 15 mg by mouth daily., Disp: , Rfl:    cyanocobalamin (VITAMIN B12) 1000 MCG tablet, Take 1,000 mcg by mouth daily., Disp: , Rfl:    metoprolol succinate (TOPROL XL) 25 MG 24 hr tablet, Take 1 tablet (25 mg total) by mouth 2 (two) times daily., Disp: 180 tablet, Rfl: 3  No Known Allergies   ROS  As noted in HPI.   Physical Exam  BP 110/88 (BP Location: Left Arm)   Pulse 75   Temp 98 F (36.7 C) (Oral)   Resp 19   SpO2 94%   Constitutional: Well developed, well nourished, no acute distress Eyes:  EOMI, conjunctiva normal bilaterally HENT: Normocephalic, atraumatic,mucus membranes moist.  Clear nasal congestion.  Normal turbinates.  No maxillary, frontal sinus tenderness.  No obvious  postnasal drip. Neck: No cervical lymphadenopathy Respiratory: Normal inspiratory effort, lungs clear bilaterally, good air movement Cardiovascular: Normal rate, regular rhythm, no murmurs rubs or gallops.  Capillary refill less than 2 seconds GI: nondistended soft, nontender, active bowel sounds.  No rebound, guarding. skin: No rash, skin intact Musculoskeletal: no deformities Neurologic: Alert & oriented x 3, no focal neuro deficits Psychiatric: Speech and behavior appropriate   ED Course   Medications - No data to display  Orders Placed This Encounter  Procedures   Resp Panel by RT-PCR (Flu A&B, Covid) Anterior Nasal Swab    Standing Status:   Standing    Number of Occurrences:   1   Results for orders placed or performed during the hospital encounter of  03/21/23  Resp Panel by RT-PCR (Flu A&B, Covid) Anterior Nasal Swab   Collection Time: 03/21/23  1:05 PM   Specimen: Anterior Nasal Swab  Result Value Ref Range   SARS Coronavirus 2 by RT PCR NEGATIVE NEGATIVE   Influenza A by PCR NEGATIVE NEGATIVE   Influenza B by PCR NEGATIVE NEGATIVE     No results found for this or any previous visit (from the past 24 hours). No results found.  ED Clinical Impression  1. Upper respiratory tract infection, unspecified type   2. Viral sinusitis   3. Diarrhea, unspecified type   4. Encounter for laboratory testing for COVID-19 virus      ED Assessment/Plan      1.  URI/sinusitis.  Suspect viral URI.  Will check COVID and flu as patient is a candidate for antivirals if either 1 of these are positive.  Will contact patient at [phone] they come back positive.  [facility] narcotic database reviewed.  No opiate prescriptions in the past 2 years.  COVID, flu PCR negative.  Home with supportive treatment.  Atrovent nasal spray since Flonase is not working, saline nasal irrigation, Tylenol 1000 mg 3 times daily, Tussionex which will stop the cough and also slow down the  diarrhea.  2.  Diarrhea.  Patient states he usually has diarrhea several days after starting Ozempic.  I also suspect that the drugs that he received for his ablation are contributing to the problem.  His abdomen is benign, he appears well-hydrated.  May continue Pepto-Bismol and/or Imodium.  Discussed labs, MDM, treatment plan, and plan for follow-up with patient. patient agrees with plan.   Meds ordered this encounter  Medications   chlorpheniramine-HYDROcodone (TUSSIONEX) 10-8 MG/5ML    Sig: Take 5 mLs by mouth every 12 (twelve) hours as needed for cough.    Dispense:  60 mL    Refill:  0   ipratropium (ATROVENT) 0.06 % nasal spray    Sig: Place 2 sprays into both nostrils 4 (four) times daily.    Dispense:  15 mL    Refill:  0      *This clinic note was created using Scientist, clinical (histocompatibility and immunogenetics). Therefore, there may be occasional mistakes despite careful proofreading.  ?    Domenick Gong, MD 03/23/23 [phone]

## 2023-03-21 NOTE — Discharge Instructions (Signed)
Will contact you if and only if your COVID or flu, positive.  I will prescribe the appropriate antiviral if this is the case.  Most sinus infections are viral and do not need antibiotics unless you have a high fever, have had this for 10 days, or you get better and then get sick again. Use a NeilMed sinus rinse with distilled water as often as you want to to reduce nasal congestion. Follow the directions on the box.  Try the Atrovent nasal spray.  Discontinue the Flonase.  1000 mg of Tylenol 3 times a day as needed for sinus pain and pressure.  Continue the Pepto-Bismol and Imodium if it helps.  The Tussionex will stop the cough and will also constipate you.  Make sure you drink plenty of extra electrolyte containing fluids.  I suspect that this should resolve in several days.  Go to www.goodrx.com to look up your medications. This will give you a list of where you can find your prescriptions at the most affordable prices. Or you can ask the pharmacist what the cash price is. This is frequently cheaper than going through insurance.

## 2023-03-21 NOTE — ED Triage Notes (Signed)
Sx started yesterday   Burning sensation in nose Post nasal drip Cough  Irritated throat Blackinsh mucus Heart ablastin Monday  Diarrhea 24 hours; started Ozempic back on  Monday night

## 2023-04-17 NOTE — Progress Notes (Signed)
Remote pacemaker transmission.   

## 2023-04-17 NOTE — Addendum Note (Signed)
Addended by: Elease Etienne A on: 04/17/2023 06:47 PM   Modules accepted: Orders

## 2023-04-25 ENCOUNTER — Ambulatory Visit: Payer: Medicare Other | Admitting: Cardiology

## 2023-05-01 NOTE — Progress Notes (Signed)
Cardiology Office Note Date:  05/02/2023  Patient ID:  Darius Schiavo., DOB [birth year], MRN [id number] PCP:  Duanne Limerick, MD  Cardiologist:  None Electrophysiologist: Lanier Prude, MD    Chief Complaint: afib ablation follow-up  History of Present Illness: Darius Brown. is a 68 y.o. male with PMH notable for tachy-brady s/p PPM, parox AFib, non-obs CAD, HTN, OSA; seen today for Lanier Prude, MD for post-procedure follow-up.  He is s/p AF ablation with PVI 07/2022 by Dr. Lalla Brothers, previous patient of [facility]  He is s/p redo AF ablation with PVI, posterior wall on 03/2023.   On follow-up today, the patient has been monitoring his heart rhythm daily and reports no episodes of Afib. The patient also discusses his blood pressure, which he has been monitoring at home. Most readings have been within normal limits, with very few readings in the 140s systolic.  No bleeding concerns on eliquis BID.  he denies chest pain, PND, orthopnea, nausea, vomiting, dizziness, syncope, weight gain, or early satiety.     Device Information: MDT Micra, imp 04/2021; dx tachy-brady Implanted by Dr. Darrold Junker, [facility]  AAD History: Amiodarone, stopped due to ?lack of A-fib Flecainide - briefly started, stopped d/t failed ETT with concern for significant CAD Multaq - current   ROS:  Please see the history of present illness. All other systems are reviewed and otherwise negative.   PHYSICAL EXAM:  VS:  BP 110/68 (BP Location: Left Arm, Patient Position: Sitting, Cuff Size: Large)   Pulse (!) 58   Ht 5\' 10"  (1.778 m)   Wt 252 lb (114.3 kg)   SpO2 98%   BMI 36.16 kg/m  BMI: Body mass index is 36.16 kg/m.  Wt Readings from Last 3 Encounters:  05/02/23 252 lb (114.3 kg)  03/17/23 250 lb (113.4 kg)  03/05/23 250 lb (113.4 kg)    GEN- The patient is well appearing, alert and oriented x 3 today.   Lungs- Clear to ausculation bilaterally, normal work of breathing.  Heart- Regular rate  and rhythm, no murmurs, rubs or gallops Extremities- No peripheral edema, warm, dry Skin -leadless pacemaker maker insertion site well-healed  Device interrogation done today and reviewed by myself:  Battery >8 years Lead threshold, impedence, sensing stable  Rare V pacing No changes made today   EKG is ordered. Personal review of EKG from today shows:   EKG Interpretation Date/Time:  Friday May 02 2023 11:06:58 EST Ventricular Rate:  58 PR Interval:  216 QRS Duration:  102 QT Interval:  442 QTC Calculation: 433 R Axis:   -36  Text Interpretation: Sinus bradycardia with 1st degree A-V block Left axis deviation Confirmed by Darius Brown [phone]) on 05/02/2023 12:03:17 PM     Additional studies reviewed include: Previous EP, cardiology notes.   Cardiac CT, 03/06/2023 1. There is normal pulmonary vein drainage into the left atrium. (2 on the right and 2 on the left) with ostial measurements as above.  2. The left atrial appendage is a Windsock type with ostial size 20 x 16 mm and length 38 mm, Area 25 mm2. There is no thrombus in the left atrial appendage. 3. The esophagus runs in the left atrial midline and is not in the proximity to any of the pulmonary veins.  4. Coronary calcium score 674. This was 83rd percentile for age/gender.  R/LHC, 11/12/2022 Nonobstructive coronary artery disease with ectatic right coronary artery and focal 40% mid vessel stenosis.  No significant disease noted  in the left coronary artery. Normal left heart filling pressures. Upper normal to mildly elevated right heart and pulmonary artery pressures. Borderline low Fick cardiac output/index.  TTE, 11/07/2022  1. Left ventricular ejection fraction, by estimation, is 55 to 60%. The left ventricle has normal function. The left ventricle has no regional wall motion abnormalities. Left ventricular diastolic parameters were normal. The average left ventricular global longitudinal strain is -17.8 %. The  global longitudinal strain is normal.   2. Right ventricular systolic function is low normal. The right ventricular size is mildly enlarged.   3. Left atrial size was mildly dilated.   4. The mitral valve is normal in structure. No evidence of mitral valve regurgitation.   5. The aortic valve is grossly normal. Aortic valve regurgitation is mild.   6. The inferior vena cava is normal in size with greater than 50% respiratory variability, suggesting right atrial pressure of 3 mmHg.   ETT, 11/01/2022   down sloping ST depression (V1, V2 and V3) was noted. Exercise treadmill study, flecainide study  Resting EKG : sinus bradycardia rate 54 bpm resting blood pressure 105/74 Exercised on regular Bruce protocol  Hypotensive with exercise,  Blood pressure 79/64 at 2 minutes 50 seconds, rate 80 bpm Blood pressure 81/55 at 3 minutes 50 seconds extending into recovery Blood pressure recovered 106/82 at 1 minute in recovery rate 70 bpm No significant ST or T wave changes concerning for ischemia at peak stress or in recovery Peak heart rate 91 bpm, 59% of maximum predicted heart rate Achieved 5.5 METS, total exercise time 4 minutes   Final impression No significant ST or T wave changes or arrhythmia noted at peak stress or in recovery  CT Chest, 08/15/2022  1. Aortic root measures 4.3 cm at the sinuses of Valsalva. Recommend annual imaging followup by CTA or MRA. This recommendation follows 2010 ACCF/AHA/AATS/ACR/ASA/SCA/SCAI/SIR/STS/SVM Guidelines for the Diagnosis and Management of Patients with Thoracic Aortic Disease. Circulation. 2010; 121: Z610-R604. Aortic aneurysm NOS (ICD10-I71.9) 2. Coronary artery calcification. 3. Prominent pulmonary trunk. 4. 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  CT Cardiac morph, 08/15/2022 1. There is normal pulmonary vein drainage into the left atrium.  2. The left atrial appendage is a Windsock type with  ostial size 18 x 13 mm and length 38 mm, Area 16 mm2. There is no thrombus in the left atrial appendage  3. The esophagus runs in the left atrial midline and is not in the proximity to any of the pulmonary veins.  4. Coronary calcium score 500 (LAD 55.9, RCA 444). 79th percentile for age and gender matched controls.  TTE, 04/26/2020 NORMAL LEFT VENTRICULAR SYSTOLIC FUNCTION  NORMAL RIGHT VENTRICULAR SYSTOLIC FUNCTION  TRIVIAL REGURGITATION NOTED (See above)  NO VALVULAR STENOSIS  TRIVIAL AR, MR, TR  EF 55%     ASSESSMENT AND PLAN:  #) parox AFib #) fatigue S/p ablation 07/2022, and redo ablation 03/2023, both with Dr. Lalla Brothers Maintaining sinus rhythm since procedure Continue 400mg  multaq BID  #) Hypercoag due to A-fib CHA2DS2-VASc Score = 3 [CHF History: 0, HTN History: 1, Diabetes History: 1, Stroke History: 0, Vascular Disease History: 0, Age Score: 1, Gender Score: 0].  Therefore, the patient's annual risk of stroke is 3.2 %. OAC 5 mg Eliquis twice daily, appropriately dosed No bleeding concerns   #) SND s/p MDT Micra leadless PPM Rare V pacing Device functioning well, see Paceart for details  #) HTN Well-controlled in office  today and on home readings Continue 20mg  lisinopril, 25mg  jardiance      Current medicines are reviewed at length with the patient today.   The patient does not have concerns regarding his medicines.  The following changes were made today:  none  Labs/ tests ordered today include:  Orders Placed This Encounter  Procedures   EKG 12-Lead     Disposition: Follow up with Dr. Lalla Brothers  in ~6 weeks for pre-ablation appt     Signed, Darius Don, NP  05/02/23  12:09 PM  Electrophysiology [facility]

## 2023-05-02 ENCOUNTER — Other Ambulatory Visit: Payer: Self-pay

## 2023-05-02 ENCOUNTER — Encounter: Payer: Self-pay | Admitting: Cardiology

## 2023-05-02 ENCOUNTER — Ambulatory Visit: Payer: Federal, State, Local not specified - PPO | Attending: Cardiology | Admitting: Cardiology

## 2023-05-02 VITALS — BP 110/68 | HR 58 | Ht 70.0 in | Wt 252.0 lb

## 2023-05-02 DIAGNOSIS — I495 Sick sinus syndrome: Secondary | ICD-10-CM | POA: Diagnosis not present

## 2023-05-02 DIAGNOSIS — Z950 Presence of cardiac pacemaker: Secondary | ICD-10-CM | POA: Diagnosis not present

## 2023-05-02 DIAGNOSIS — I480 Paroxysmal atrial fibrillation: Secondary | ICD-10-CM | POA: Diagnosis not present

## 2023-05-02 DIAGNOSIS — I10 Essential (primary) hypertension: Secondary | ICD-10-CM

## 2023-05-02 DIAGNOSIS — D6869 Other thrombophilia: Secondary | ICD-10-CM | POA: Diagnosis not present

## 2023-05-02 LAB — CUP PACEART INCLINIC DEVICE CHECK
Date Time Interrogation Session: 20250131130213
Implantable Pulse Generator Implant Date: 20230105

## 2023-05-04 ENCOUNTER — Encounter: Payer: Self-pay | Admitting: Cardiology

## 2023-05-16 ENCOUNTER — Other Ambulatory Visit: Payer: Self-pay | Admitting: *Deleted

## 2023-05-16 DIAGNOSIS — I480 Paroxysmal atrial fibrillation: Secondary | ICD-10-CM

## 2023-05-16 MED ORDER — ELIQUIS 5 MG PO TABS: 5.0000 mg | ORAL_TABLET | Freq: Two times a day (BID) | ORAL | 1 refills | Status: DC

## 2023-05-16 NOTE — Telephone Encounter (Signed)
Eliquis 5mg  refill request received. Patient is 69 years old, weight-114.3kg, Crea-1.03 on 03/05/23, Diagnosis-Afib, and last seen by Levy Sjogren on 05/02/23. Dose is appropriate based on dosing criteria. Will send in refill to requested pharmacy.

## 2023-06-10 NOTE — Progress Notes (Unsigned)
 Cardiology Office Note Date:  06/16/2023  Patient ID:  Darius Utz., DOB February 13, 1956, MRN [id number] PCP:  Duanne Limerick, MD  Cardiologist:  None Electrophysiologist: Lanier Prude, MD    Chief Complaint: afib ablation follow-up  History of Present Illness: Darius Brown. is a 68 y.o. male with PMH notable for tachy-brady s/p leadless PPM, parox AFib, non-obs CAD, HTN, OSA; seen today for Lanier Prude, MD for post-procedure follow-up.  He is s/p AF ablation with PVI 07/2022 by Dr. Lalla Brothers, previous patient of [facility].  He is s/p redo AF ablation with PVI, posterior wall on 03/2023.  I saw him for 1 month post-ablation appt where he was maintaining sinus rhythm.   On follow-up today, he has  not had any episodes of AFib since before the ablation. He has continued to take multq BID along with eliquis BID. NO bleeding concerns on eliquis.  He does note some increased fatigue after walking up 3 flights of stairs recently, but he does not regularly exercise  he denies chest pain, PND, orthopnea, nausea, vomiting, dizziness, syncope, weight gain, or early satiety.    His wife joins for visit whom I have met previously.   Device Information: MDT Micra, imp 04/2021; dx tachy-brady Implanted by Dr. Darrold Junker, [facility]  AAD History: Amiodarone, stopped due to ?lack of A-fib Flecainide - briefly started, stopped d/t failed ETT with concern for significant CAD Multaq - current   ROS:  Please see the history of present illness. All other systems are reviewed and otherwise negative.   PHYSICAL EXAM:  VS:  BP 115/79 (BP Location: Left Arm, Patient Position: Sitting, Cuff Size: Normal)   Pulse (!) 58   Ht 5\' 11"  (1.803 m)   Wt 247 lb (112 kg)   SpO2 92%   BMI 34.45 kg/m  BMI: Body mass index is 34.45 kg/m.  Wt Readings from Last 3 Encounters:  06/16/23 247 lb (112 kg)  05/02/23 252 lb (114.3 kg)  03/17/23 250 lb (113.4 kg)     GEN- The patient is well appearing, alert  and oriented x 3 today.   Lungs- Clear to ausculation bilaterally, normal work of breathing.  Heart- Regular rate and rhythm, no murmurs, rubs or gallops Extremities- 1+ peripheral edema, warm, dry Skin -leadless pacemaker maker insertion site well-healed  Device interrogation done today and reviewed by myself:  Battery >8 years Lead threshold, impedence, sensing stable  Low V pacing at 2% No changes made today   EKG is ordered. Personal review of EKG from today shows:   EKG Interpretation Date/Time:  Monday June 16 2023 10:22:02 EDT Ventricular Rate:  58 PR Interval:  202 QRS Duration:  100 QT Interval:  434 QTC Calculation: 426 R Axis:   -41  Text Interpretation: Sinus bradycardia Left axis deviation Incomplete right bundle branch block Confirmed by Darius Brown [phone]) on 06/16/2023 10:24:03 AM     Additional studies reviewed include: Previous EP, cardiology notes.   Cardiac CT, 03/06/2023 1. There is normal pulmonary vein drainage into the left atrium. (2 on the right and 2 on the left) with ostial measurements as above.  2. The left atrial appendage is a Windsock type with ostial size 20 x 16 mm and length 38 mm, Area 25 mm2. There is no thrombus in the left atrial appendage. 3. The esophagus runs in the left atrial midline and is not in the proximity to any of the pulmonary veins.  4. Coronary calcium score 674.  This was 83rd percentile for age/gender.  R/LHC, 11/12/2022 Nonobstructive coronary artery disease with ectatic right coronary artery and focal 40% mid vessel stenosis.  No significant disease noted in the left coronary artery. Normal left heart filling pressures. Upper normal to mildly elevated right heart and pulmonary artery pressures. Borderline low Fick cardiac output/index.  TTE, 11/07/2022  1. Left ventricular ejection fraction, by estimation, is 55 to 60%. The left ventricle has normal function. The left ventricle has no regional wall motion  abnormalities. Left ventricular diastolic parameters were normal. The average left ventricular global longitudinal strain is -17.8 %. The global longitudinal strain is normal.   2. Right ventricular systolic function is low normal. The right ventricular size is mildly enlarged.   3. Left atrial size was mildly dilated.   4. The mitral valve is normal in structure. No evidence of mitral valve regurgitation.   5. The aortic valve is grossly normal. Aortic valve regurgitation is mild.   6. The inferior vena cava is normal in size with greater than 50% respiratory variability, suggesting right atrial pressure of 3 mmHg.   ETT, 11/01/2022   down sloping ST depression (V1, V2 and V3) was noted. Exercise treadmill study, flecainide study  Resting EKG : sinus bradycardia rate 54 bpm resting blood pressure 105/74 Exercised on regular Bruce protocol  Hypotensive with exercise,  Blood pressure 79/64 at 2 minutes 50 seconds, rate 80 bpm Blood pressure 81/55 at 3 minutes 50 seconds extending into recovery Blood pressure recovered 106/82 at 1 minute in recovery rate 70 bpm No significant ST or T wave changes concerning for ischemia at peak stress or in recovery Peak heart rate 91 bpm, 59% of maximum predicted heart rate Achieved 5.5 METS, total exercise time 4 minutes   Final impression No significant ST or T wave changes or arrhythmia noted at peak stress or in recovery  CT Chest, 08/15/2022  1. Aortic root measures 4.3 cm at the sinuses of Valsalva. Recommend annual imaging followup by CTA or MRA. This recommendation follows 2010 ACCF/AHA/AATS/ACR/ASA/SCA/SCAI/SIR/STS/SVM Guidelines for the Diagnosis and Management of Patients with Thoracic Aortic Disease. Circulation. 2010; 121: W295-A213. Aortic aneurysm NOS (ICD10-I71.9) 2. Coronary artery calcification. 3. Prominent pulmonary trunk. 4. 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  CT Cardiac  morph, 08/15/2022 1. There is normal pulmonary vein drainage into the left atrium.  2. The left atrial appendage is a Windsock type with ostial size 18 x 13 mm and length 38 mm, Area 16 mm2. There is no thrombus in the left atrial appendage  3. The esophagus runs in the left atrial midline and is not in the proximity to any of the pulmonary veins.  4. Coronary calcium score 500 (LAD 55.9, RCA 444). 79th percentile for age and gender matched controls.  TTE, 04/26/2020 NORMAL LEFT VENTRICULAR SYSTOLIC FUNCTION  NORMAL RIGHT VENTRICULAR SYSTOLIC FUNCTION  TRIVIAL REGURGITATION NOTED (See above)  NO VALVULAR STENOSIS  TRIVIAL AR, MR, TR  EF 55%     ASSESSMENT AND PLAN:  #) parox AFib S/p ablation 07/2022, and redo ablation 03/2023, both with Dr. Lalla Brothers Maintaining sinus rhythm since procedure Stop multaq, continue to monitor for AFib by symptoms and BP machine  #) Hypercoag due to A-fib CHA2DS2-VASc Score = 4 [CHF History: 0, HTN History: 1, Diabetes History: 1, Stroke History: 0, Vascular Disease History: 1, Age Score: 1, Gender Score: 0].  Therefore, the patient's annual risk of stroke is 4.8 %. OAC  5 mg Eliquis twice daily, appropriately dosed No bleeding concerns   #) SND s/p MDT Micra leadless PPM Rare V pacing Device functioning well, see Paceart for details  #) HTN Well-controlled in office today and on home readings Continue 20mg  lisinopril, 25mg  jardiance  Continue to check BP 2-3 times a week, and bring BP log to follow-up appts    Current medicines are reviewed at length with the patient today.   The patient does not have concerns regarding his medicines.  The following changes were made today:   STOP multaq  Labs/ tests ordered today include:  Orders Placed This Encounter  Procedures   EKG 12-Lead     Disposition: Follow up with Dr. Lalla Brothers or EP APP in 6-9 months, sooner if AFib recurs    Signed, Darius Don, NP  06/16/23  11:55 AM   Electrophysiology [facility]

## 2023-06-12 ENCOUNTER — Ambulatory Visit (INDEPENDENT_AMBULATORY_CARE_PROVIDER_SITE_OTHER): Payer: Medicare Other

## 2023-06-12 ENCOUNTER — Encounter: Payer: Self-pay | Admitting: Cardiology

## 2023-06-12 DIAGNOSIS — I495 Sick sinus syndrome: Secondary | ICD-10-CM | POA: Diagnosis not present

## 2023-06-12 LAB — CUP PACEART REMOTE DEVICE CHECK
Battery Remaining Longevity: 96 mo
Battery Voltage: 3.03 V
Brady Statistic AS VP Percent: 0 %
Brady Statistic AS VS Percent: 0 %
Brady Statistic RV Percent Paced: 1.8 %
Date Time Interrogation Session: 20250313054300
Implantable Pulse Generator Implant Date: 20230105
Lead Channel Impedance Value: 510 Ohm
Lead Channel Pacing Threshold Amplitude: 0.75 V
Lead Channel Pacing Threshold Pulse Width: 0.24 ms
Lead Channel Sensing Intrinsic Amplitude: 10.013 mV
Lead Channel Setting Pacing Amplitude: 1.375
Lead Channel Setting Pacing Pulse Width: 0.24 ms
Lead Channel Setting Sensing Sensitivity: 2 mV

## 2023-06-16 ENCOUNTER — Ambulatory Visit: Payer: Medicare Other | Attending: Cardiology | Admitting: Cardiology

## 2023-06-16 ENCOUNTER — Encounter: Payer: Self-pay | Admitting: Cardiology

## 2023-06-16 VITALS — BP 115/79 | HR 58 | Ht 71.0 in | Wt 247.0 lb

## 2023-06-16 DIAGNOSIS — D6869 Other thrombophilia: Secondary | ICD-10-CM | POA: Diagnosis not present

## 2023-06-16 DIAGNOSIS — I480 Paroxysmal atrial fibrillation: Secondary | ICD-10-CM | POA: Diagnosis not present

## 2023-06-16 DIAGNOSIS — I495 Sick sinus syndrome: Secondary | ICD-10-CM

## 2023-06-16 DIAGNOSIS — Z950 Presence of cardiac pacemaker: Secondary | ICD-10-CM

## 2023-06-16 DIAGNOSIS — I10 Essential (primary) hypertension: Secondary | ICD-10-CM

## 2023-06-16 LAB — CUP PACEART INCLINIC DEVICE CHECK
Battery Remaining Longevity: 96 mo
Battery Voltage: 3.03 V
Brady Statistic AS VP Percent: 0 %
Brady Statistic AS VS Percent: 0 %
Brady Statistic RV Percent Paced: 2.05 %
Date Time Interrogation Session: 20250317115815
Implantable Pulse Generator Implant Date: 20230105
Lead Channel Impedance Value: 590 Ohm
Lead Channel Pacing Threshold Amplitude: 0.875 V
Lead Channel Pacing Threshold Pulse Width: 0.24 ms
Lead Channel Sensing Intrinsic Amplitude: 11.138 mV
Lead Channel Setting Pacing Amplitude: 1.375
Lead Channel Setting Pacing Pulse Width: 0.24 ms
Lead Channel Setting Sensing Sensitivity: 2 mV

## 2023-06-16 NOTE — Patient Instructions (Addendum)
 Medication Instructions:  STOP Multaq (dronedarone)  *If you need a refill on your cardiac medications before your next appointment, please call your pharmacy*   Follow-Up: At [facility], you and your health needs are our priority.  As part of our continuing mission to provide you with exceptional heart care, we have created designated Provider Care Teams.  These Care Teams include your primary Cardiologist (physician) and Advanced Practice Providers (APPs -  Physician Assistants and Nurse Practitioners) who all work together to provide you with the care you need, when you need it.  We recommend signing up for the patient portal called "MyChart".  Sign up information is provided on this After Visit Summary.  MyChart is used to connect with patients for Virtual Visits (Telemedicine).  Patients are able to view lab/test results, encounter notes, upcoming appointments, etc.  Non-urgent messages can be sent to your provider as well.   To learn more about what you can do with MyChart, go to [email or web address].    Your next appointment:   6 month(s)  Provider:   [name], NP

## 2023-07-25 NOTE — Progress Notes (Signed)
 Remote pacemaker transmission.

## 2023-09-11 ENCOUNTER — Ambulatory Visit (INDEPENDENT_AMBULATORY_CARE_PROVIDER_SITE_OTHER): Payer: Medicare Other

## 2023-09-11 DIAGNOSIS — I495 Sick sinus syndrome: Secondary | ICD-10-CM

## 2023-09-11 LAB — CUP PACEART REMOTE DEVICE CHECK
Battery Remaining Longevity: 96 mo
Battery Voltage: 3.03 V
Brady Statistic AS VP Percent: 0 %
Brady Statistic AS VS Percent: 0 %
Brady Statistic RV Percent Paced: 0.68 %
Date Time Interrogation Session: 20250612065000
Implantable Pulse Generator Implant Date: 20230105
Lead Channel Impedance Value: 530 Ohm
Lead Channel Pacing Threshold Amplitude: 0.75 V
Lead Channel Pacing Threshold Pulse Width: 0.24 ms
Lead Channel Sensing Intrinsic Amplitude: 9 mV
Lead Channel Setting Pacing Amplitude: 1.375
Lead Channel Setting Pacing Pulse Width: 0.24 ms
Lead Channel Setting Sensing Sensitivity: 2 mV

## 2023-09-12 ENCOUNTER — Ambulatory Visit: Payer: Self-pay | Admitting: Cardiology

## 2023-11-03 NOTE — Progress Notes (Signed)
 Remote pacemaker transmission.

## 2023-12-11 ENCOUNTER — Ambulatory Visit (INDEPENDENT_AMBULATORY_CARE_PROVIDER_SITE_OTHER): Payer: Medicare Other

## 2023-12-11 DIAGNOSIS — I495 Sick sinus syndrome: Secondary | ICD-10-CM

## 2023-12-11 LAB — CUP PACEART REMOTE DEVICE CHECK
Battery Remaining Longevity: 96 mo
Battery Voltage: 3.03 V
Brady Statistic AS VP Percent: 0 %
Brady Statistic AS VS Percent: 0 %
Brady Statistic RV Percent Paced: 0.51 %
Date Time Interrogation Session: 20250911060200
Implantable Pulse Generator Implant Date: 20230105
Lead Channel Impedance Value: 550 Ohm
Lead Channel Pacing Threshold Amplitude: 0.75 V
Lead Channel Pacing Threshold Pulse Width: 0.24 ms
Lead Channel Sensing Intrinsic Amplitude: 9.675 mV
Lead Channel Setting Pacing Amplitude: 1.375
Lead Channel Setting Pacing Pulse Width: 0.24 ms
Lead Channel Setting Sensing Sensitivity: 2 mV

## 2023-12-12 ENCOUNTER — Ambulatory Visit: Payer: Self-pay | Admitting: Cardiology

## 2023-12-19 NOTE — Progress Notes (Signed)
 Remote PPM Transmission

## 2024-01-22 ENCOUNTER — Other Ambulatory Visit: Payer: Self-pay | Admitting: Cardiology

## 2024-01-22 DIAGNOSIS — I480 Paroxysmal atrial fibrillation: Secondary | ICD-10-CM

## 2024-01-23 ENCOUNTER — Telehealth: Payer: Self-pay | Admitting: Cardiology

## 2024-01-23 DIAGNOSIS — I480 Paroxysmal atrial fibrillation: Secondary | ICD-10-CM

## 2024-01-23 MED ORDER — ELIQUIS 5 MG PO TABS
5.0000 mg | ORAL_TABLET | Freq: Two times a day (BID) | ORAL | 1 refills | Status: AC
Start: 2024-01-23 — End: ?

## 2024-01-23 NOTE — Telephone Encounter (Signed)
 Eliquis  5mg  refill request received. Patient is 68 years old, weight-112kg, Crea- 1.03 on 03/05/23, Diagnosis-Afib, and last seen by Suzann Riddle on 06/16/23. Dose is appropriate based on dosing criteria. Will send in refill to requested pharmacy.

## 2024-01-23 NOTE — Telephone Encounter (Signed)
 Prescription refill request for Eliquis  received. Indication: A-Fib Last office visit: 06/16/2023 Scr:   1.03  last lab on 03/05/2023 Age:  68 yrs. Weight:  112 kg  Pt meet protocol and medication was sent to pt's pharmacy.

## 2024-01-23 NOTE — Telephone Encounter (Signed)
 Refill Request.

## 2024-01-23 NOTE — Telephone Encounter (Signed)
*  STAT* If patient is at the pharmacy, call can be transferred to refill team.   1. Which medications need to be refilled? (please list name of each medication and dose if known)   ELIQUIS  5 MG TABS tablet   2. Would you like to learn more about the convenience, safety, & potential cost savings by using the [facility]?   3. Are you open to using the [facility] (Type [facility]. ).  4. Which pharmacy/location (including street and city if local pharmacy) is medication to be sent to?  [facility] - [place], [place] - [address] Portal to Registered [facility] Sites   5. Do they need a 30 day or 90 day supply?  90 day  Wife stated patient only has 4 days left of this medication.  Wife wants a small supply [id number] [place], [place] - [address] until his mail order supply comes in.  Patient has an appointment scheduled with CANDIE Needle, NP on 10/27.

## 2024-01-25 NOTE — Progress Notes (Deleted)
 Electrophysiology Clinic Note    Date:  01/25/2024  Patient ID:  Darius Utsey., DOB 1956-03-09, MRN [id number] PCP:  Joshua Cathryne BROCKS, MD (Inactive)  Cardiologist:  None  Electrophysiologist:  OLE ONEIDA HOLTS, MD  Electrophysiology APP:  Khristian Phillippi, NP    ***refresh  Discussed the use of AI scribe software for clinical note transcription with the patient, who gave verbal consent to proceed.   Patient Profile    Chief Complaint: ***  History of Present Illness: Darius Hegner. is a 68 y.o. male with PMH notable for tachy-brady s/p leadless PPM, parox AFib, non-obs CAD, HTN, OSA ; seen today for OLE ONEIDA HOLTS, MD for routine electrophysiology followup.   He is s/p AF ablation with PVI 07/2022 by Dr. Holts, previous patient of [facility].  He is s/p redo AF ablation with PVI, posterior wall on 03/2023.   I last saw him 05/2023 for 3 month post-ablation appt where he was maintaining sinus rhythm and multaq  was stopped   On follow-up today,  *** AF burden, symptoms *** palpitations *** bleeding concerns   - needs BMP, CBC  Since last being seen in our clinic the patient reports doing ***.  he denies chest pain, palpitations, dyspnea, PND, orthopnea, nausea, vomiting, dizziness, syncope, edema, weight gain, or early satiety.      Arrhythmia/Device History MDT Micra, imp 04/2021; dx tachy-brady Implanted by Dr. Ammon, [facility]    AAD History: Amiodarone, stopped due to ?lack of A-fib Flecainide  - briefly started, stopped d/t failed ETT with concern for significant CAD Multaq  - stopped 05/2023     ROS:  Please see the history of present illness. All other systems are reviewed and otherwise negative.    Physical Exam    VS:  There were no vitals taken for this visit. BMI: There is no height or weight on file to calculate BMI.           Wt Readings from Last 3 Encounters:  06/16/23 247 lb (112 kg)  05/02/23 252 lb (114.3 kg)  03/17/23 250 lb  (113.4 kg)     GEN- The patient is well appearing, alert and oriented x 3 today.   Lungs- Clear to ausculation bilaterally, normal work of breathing.  Heart- {Blank single:19197::Regular,Irregularly irregular} rate and rhythm, no murmurs, rubs or gallops Extremities- {EDEMA LEVEL:28147::No} peripheral edema, warm, dry Skin-  *** device pocket well-healed, no tethering   *** Brief check performed without iterative lead testing/measurements   Device interrogation done today and reviewed by myself:  Battery *** Lead thresholds, impedence, sensing stable *** *** episodes *** changes made today   Studies Reviewed   Previous EP, cardiology notes.    EKG is ordered. Personal review of EKG from today shows:  ***        Cardiac CT, 03/06/2023 1. There is normal pulmonary vein drainage into the left atrium. (2 on the right and 2 on the left) with ostial measurements as above.  2. The left atrial appendage is a Windsock type with ostial size 20 x 16 mm and length 38 mm, Area 25 mm2. There is no thrombus in the left atrial appendage. 3. The esophagus runs in the left atrial midline and is not in the proximity to any of the pulmonary veins.  4. Coronary calcium  score 674. This was 83rd percentile for age/gender.   R/LHC, 11/12/2022 Nonobstructive coronary artery disease with ectatic right coronary artery and focal 40% mid vessel stenosis.  No significant  disease noted in the left coronary artery. Normal left heart filling pressures. Upper normal to mildly elevated right heart and pulmonary artery pressures. Borderline low Fick cardiac output/index.   TTE, 11/07/2022  1. Left ventricular ejection fraction, by estimation, is 55 to 60%. The left ventricle has normal function. The left ventricle has no regional wall motion abnormalities. Left ventricular diastolic parameters were normal. The average left ventricular global longitudinal strain is -17.8 %. The global longitudinal strain is  normal.   2. Right ventricular systolic function is low normal. The right ventricular size is mildly enlarged.   3. Left atrial size was mildly dilated.   4. The mitral valve is normal in structure. No evidence of mitral valve regurgitation.   5. The aortic valve is grossly normal. Aortic valve regurgitation is mild.   6. The inferior vena cava is normal in size with greater than 50% respiratory variability, suggesting right atrial pressure of 3 mmHg.    ETT, 11/01/2022   down sloping ST depression (V1, V2 and V3) was noted. Exercise treadmill study, flecainide  study  Resting EKG : sinus bradycardia rate 54 bpm resting blood pressure 105/74 Exercised on regular Bruce protocol  Hypotensive with exercise,  Blood pressure 79/64 at 2 minutes 50 seconds, rate 80 bpm Blood pressure 81/55 at 3 minutes 50 seconds extending into recovery Blood pressure recovered 106/82 at 1 minute in recovery rate 70 bpm No significant ST or T wave changes concerning for ischemia at peak stress or in recovery Peak heart rate 91 bpm, 59% of maximum predicted heart rate Achieved 5.5 METS, total exercise time 4 minutes   Final impression No significant ST or T wave changes or arrhythmia noted at peak stress or in recovery   CT Chest, 08/15/2022  1. Aortic root measures 4.3 cm at the sinuses of Valsalva. Recommend annual imaging followup by CTA or MRA. This recommendation follows 2010 ACCF/AHA/AATS/ACR/ASA/SCA/SCAI/SIR/STS/SVM Guidelines for the Diagnosis and Management of Patients with Thoracic Aortic Disease. Circulation. 2010; 121: Z733-z630. Aortic aneurysm NOS (ICD10-I71.9) 2. Coronary artery calcification. 3. Prominent pulmonary trunk. 4. 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   CT Cardiac morph, 08/15/2022 1. There is normal pulmonary vein drainage into the left atrium.  2. The left atrial appendage is a Windsock type with ostial size 18 x 13 mm and  length 38 mm, Area 16 mm2. There is no thrombus in the left atrial appendage  3. The esophagus runs in the left atrial midline and is not in the proximity to any of the pulmonary veins.  4. Coronary calcium  score 500 (LAD 55.9, RCA 444). 79th percentile for age and gender matched controls.   TTE, 04/26/2020 NORMAL LEFT VENTRICULAR SYSTOLIC FUNCTION  NORMAL RIGHT VENTRICULAR SYSTOLIC FUNCTION  TRIVIAL REGURGITATION NOTED (See above)  NO VALVULAR STENOSIS  TRIVIAL AR, MR, TR  EF 55%      Assessment and Plan     #) parox Afib   #) Hypercoag d/t *** afib CHA2DS2-VASc Score = at least 4 [CHF History: 0, HTN History: 1, Diabetes History: 1, Stroke History: 0, Vascular Disease History: 1, Age Score: 1, Gender Score: 0].  Therefore, the patient's annual risk of stroke is 4.8 %.     {Confirm score is correct.  If not, click here to update score.  REFRESH note.  :1}   Stroke ppx - ***, appropriately dosed No bleeding concerns   #) SND s/p leadless PPM   #) ***   {  Are you ordering a CV Procedure (e.g. stress test, cath, DCCV, TEE, etc)?   Press F2        :[id number]}   Current medicines are reviewed at length with the patient today.   The patient {ACTIONS; HAS/DOES NOT HAVE:19233} concerns regarding his medicines.  The following changes were made today:  {NONE DEFAULTED:18576}  Labs/ tests ordered today include: *** No orders of the defined types were placed in this encounter.    Disposition: Follow up with {EPMDS:28135::EP Team} or EP APP {EPFOLLOW UP:28173}   Signed, Chantal Needle, NP  01/25/24  2:35 PM  Electrophysiology [facility]

## 2024-01-26 ENCOUNTER — Ambulatory Visit: Admitting: Cardiology

## 2024-01-26 DIAGNOSIS — Z950 Presence of cardiac pacemaker: Secondary | ICD-10-CM

## 2024-01-26 DIAGNOSIS — I480 Paroxysmal atrial fibrillation: Secondary | ICD-10-CM

## 2024-01-26 DIAGNOSIS — I495 Sick sinus syndrome: Secondary | ICD-10-CM

## 2024-03-04 ENCOUNTER — Other Ambulatory Visit: Payer: Self-pay | Admitting: Cardiology

## 2024-03-11 ENCOUNTER — Ambulatory Visit (INDEPENDENT_AMBULATORY_CARE_PROVIDER_SITE_OTHER): Payer: Medicare Other

## 2024-03-11 DIAGNOSIS — I495 Sick sinus syndrome: Secondary | ICD-10-CM | POA: Diagnosis not present

## 2024-03-12 LAB — CUP PACEART REMOTE DEVICE CHECK
Battery Remaining Longevity: 96 mo
Battery Voltage: 3.02 V
Brady Statistic AS VP Percent: 0 %
Brady Statistic AS VS Percent: 0 %
Brady Statistic RV Percent Paced: 0.61 %
Date Time Interrogation Session: 20251211075700
Implantable Pulse Generator Implant Date: 20230105
Lead Channel Impedance Value: 530 Ohm
Lead Channel Pacing Threshold Amplitude: 0.75 V
Lead Channel Pacing Threshold Pulse Width: 0.24 ms
Lead Channel Sensing Intrinsic Amplitude: 9.225 mV
Lead Channel Setting Pacing Amplitude: 1.25 V
Lead Channel Setting Pacing Pulse Width: 0.24 ms
Lead Channel Setting Sensing Sensitivity: 2 mV

## 2024-03-14 NOTE — Progress Notes (Unsigned)
 Electrophysiology Clinic Note    Date:  03/15/2024  Patient ID:  Darius Olden., DOB 03/25/1956, MRN [id number] PCP:  Salli Amato, MD  Cardiologist:  None  Electrophysiologist:  OLE ONEIDA HOLTS, MD  Electrophysiology APP:  Mileigh Tilley, NP      Discussed the use of AI scribe software for clinical note transcription with the patient, who gave verbal consent to proceed.   Patient Profile    Chief Complaint: AFib  History of Present Illness: Darius Hemmelgarn. is a 68 y.o. male with PMH notable for tachy-brady s/p leadless PPM, parox AFib, non-obs CAD, HTN, OSA on CPAP, T2DM, HLD ; seen today for OLE ONEIDA HOLTS, MD for routine electrophysiology followup.   He is s/p AF ablation with PVI 07/2022 and s/p redo AF ablation w isolation of pulm veins, posterior wall on 03/2023.  I saw him for routine follow-up appts after ablation, last seen on 05/2023 where he was maintaining sinus rhythm. Multaq  was stopped at that visit, eliquis  continued.   On follow-up today, he is doing very well and has no acute complaints.  He continues to check heart rhythm regularly via Kardia mobile and blood pressure log, though admits to being less diligent about it as he was in the past.  Historically he is asymptomatic of his atrial fibrillation.  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  He checks blood pressure regularly at home, readings are 120s/60-80. He continues to take Eliquis  twice a day without bleeding concerns.  He attempts to use CPAP every night, sometimes removes due to air leakage      Arrhythmia/Device History MDT Micra AV, imp 04/2021; dx tachy-brady Implanted by Dr. Ammon, [facility]    AAD History: Amiodarone, stopped due to ?lack of A-fib Flecainide  - briefly started, stopped d/t failed ETT with concern for significant CAD Multaq  - stopped 05/2023 d/t no AFib    ROS:  Please  see the history of present illness. All other systems are reviewed and otherwise negative.    Physical Exam    VS:  BP 122/72 (BP Location: Left Arm, Patient Position: Sitting, Cuff Size: Normal)   Pulse (!) 56   Ht 5' 11 (1.803 m)   Wt 243 lb 9.6 oz (110.5 kg)   SpO2 97%   BMI 33.98 kg/m  BMI: Body mass index is 33.98 kg/m.           Wt Readings from Last 3 Encounters:  03/15/24 243 lb 9.6 oz (110.5 kg)  06/16/23 247 lb (112 kg)  05/02/23 252 lb (114.3 kg)     GEN- The patient is well appearing, alert and oriented x 3 today.   Lungs- Clear to ausculation bilaterally, normal work of breathing.  Heart- Regular rate and rhythm, no murmurs, rubs or gallops Extremities- No peripheral edema, warm, dry    Studies Reviewed   Previous EP, cardiology notes.    EKG is ordered. Personal review of EKG from today shows:    EKG Interpretation Date/Time:  Monday March 15 2024 11:14:11 EST Ventricular Rate:  56 PR Interval:    QRS Duration:  100 QT Interval:  392 QTC Calculation: 378 R Axis:   -36  Text Interpretation: Normal sinus rhythm Left axis deviation Incomplete right bundle branch block Confirmed by Judithann Villamar [phone]) on 03/15/2024 11:18:36 AM    Cardiac CT, 03/06/2023 1. There is normal pulmonary vein  drainage into the left atrium. (2 on the right and 2 on the left) with ostial measurements as above.  2. The left atrial appendage is a Windsock type with ostial size 20 x 16 mm and length 38 mm, Area 25 mm2. There is no thrombus in the left atrial appendage. 3. The esophagus runs in the left atrial midline and is not in the proximity to any of the pulmonary veins.  4. Coronary calcium  score 674. This was 83rd percentile for age/gender.   R/LHC, 11/12/2022 Nonobstructive coronary artery disease with ectatic right coronary artery and focal 40% mid vessel stenosis.  No significant disease noted in the left coronary artery. Normal left heart filling pressures. Upper  normal to mildly elevated right heart and pulmonary artery pressures. Borderline low Fick cardiac output/index.   TTE, 11/07/2022  1. Left ventricular ejection fraction, by estimation, is 55 to 60%. The left ventricle has normal function. The left ventricle has no regional wall motion abnormalities. Left ventricular diastolic parameters were normal. The average left ventricular global longitudinal strain is -17.8 %. The global longitudinal strain is normal.   2. Right ventricular systolic function is low normal. The right ventricular size is mildly enlarged.   3. Left atrial size was mildly dilated.   4. The mitral valve is normal in structure. No evidence of mitral valve regurgitation.   5. The aortic valve is grossly normal. Aortic valve regurgitation is mild.   6. The inferior vena cava is normal in size with greater than 50% respiratory variability, suggesting right atrial pressure of 3 mmHg.    ETT, 11/01/2022   down sloping ST depression (V1, V2 and V3) was noted. Exercise treadmill study, flecainide  study  Resting EKG : sinus bradycardia rate 54 bpm resting blood pressure 105/74 Exercised on regular Bruce protocol  Hypotensive with exercise,  Blood pressure 79/64 at 2 minutes 50 seconds, rate 80 bpm Blood pressure 81/55 at 3 minutes 50 seconds extending into recovery Blood pressure recovered 106/82 at 1 minute in recovery rate 70 bpm No significant ST or T wave changes concerning for ischemia at peak stress or in recovery Peak heart rate 91 bpm, 59% of maximum predicted heart rate Achieved 5.5 METS, total exercise time 4 minutes   Final impression No significant ST or T wave changes or arrhythmia noted at peak stress or in recovery   CT Chest, 08/15/2022  1. Aortic root measures 4.3 cm at the sinuses of Valsalva. Recommend annual imaging followup by CTA or MRA. This recommendation follows 2010 ACCF/AHA/AATS/ACR/ASA/SCA/SCAI/SIR/STS/SVM Guidelines for the Diagnosis and Management of  Patients with Thoracic Aortic Disease. Circulation. 2010; 121: Z733-z630. Aortic aneurysm NOS (ICD10-I71.9) 2. Coronary artery calcification. 3. Prominent pulmonary trunk. 4. 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   CT Cardiac morph, 08/15/2022 1. There is normal pulmonary vein drainage into the left atrium.  2. The left atrial appendage is a Windsock type with ostial size 18 x 13 mm and length 38 mm, Area 16 mm2. There is no thrombus in the left atrial appendage  3. The esophagus runs in the left atrial midline and is not in the proximity to any of the pulmonary veins.  4. Coronary calcium  score 500 (LAD 55.9, RCA 444). 79th percentile for age and gender matched controls.   TTE, 04/26/2020 NORMAL LEFT VENTRICULAR SYSTOLIC FUNCTION  NORMAL RIGHT VENTRICULAR SYSTOLIC FUNCTION  TRIVIAL REGURGITATION NOTED (See above)  NO VALVULAR STENOSIS  TRIVIAL AR, MR, TR  EF 55%   Assessment and Plan     #) parox Afib S/p AF ablation 07/2022, redo ablation 03/2023 Maintaining sinus rhythm Continue Toprol  25 mg daily Continue to monitor heart rhythm via Kardia mobile and blood pressure machine  #) Hypercoag d/t parox afib CHA2DS2-VASc Score = at least 4 [CHF History: 0, HTN History: 1, Diabetes History: 1, Stroke History: 0, Vascular Disease History: 1, Age Score: 1, Gender Score: 0].  Therefore, the patient's annual risk of stroke is 4.8 %.    Stroke ppx - 5mg  eliquis  BID, appropriately dosed No bleeding concerns Update CBC today  #) tachy-brady s/p leadless PPM Recent remote transmission reviewed Good battery, good HR histograms Low VP  #) HTN Well controlled in office and by home readings Will defer to PCP for ongoing mgmt    Current medicines are reviewed at length with the patient today.   The patient does not have concerns regarding his medicines.  The following changes were made today:  none  Labs/ tests ordered today  include:  Orders Placed This Encounter  Procedures   CBC   EKG 12-Lead     Disposition: Follow up with Dr. Kennyth or EP APP in 12 months, prefer MD to establish care   Signed, Chantal Needle, NP  03/15/2024  11:59 AM  Electrophysiology [facility]

## 2024-03-15 ENCOUNTER — Ambulatory Visit: Attending: Cardiology | Admitting: Cardiology

## 2024-03-15 ENCOUNTER — Encounter: Payer: Self-pay | Admitting: Cardiology

## 2024-03-15 VITALS — BP 122/72 | HR 56 | Ht 71.0 in | Wt 243.6 lb

## 2024-03-15 DIAGNOSIS — D6869 Other thrombophilia: Secondary | ICD-10-CM | POA: Diagnosis not present

## 2024-03-15 DIAGNOSIS — I480 Paroxysmal atrial fibrillation: Secondary | ICD-10-CM

## 2024-03-15 DIAGNOSIS — I495 Sick sinus syndrome: Secondary | ICD-10-CM | POA: Diagnosis not present

## 2024-03-15 DIAGNOSIS — I10 Essential (primary) hypertension: Secondary | ICD-10-CM

## 2024-03-15 DIAGNOSIS — Z950 Presence of cardiac pacemaker: Secondary | ICD-10-CM

## 2024-03-15 MED ORDER — METOPROLOL SUCCINATE ER 25 MG PO TB24: 25.0000 mg | ORAL_TABLET | Freq: Two times a day (BID) | ORAL | 3 refills | Status: AC

## 2024-03-15 MED ORDER — ELIQUIS 5 MG PO TABS: 5.0000 mg | ORAL_TABLET | Freq: Two times a day (BID) | ORAL | 3 refills | Status: AC

## 2024-03-15 NOTE — Patient Instructions (Signed)
 Medication Instructions:  Your physician recommends that you continue on your current medications as directed. Please refer to the Current Medication list given to you today.  *If you need a refill on your cardiac medications before your next appointment, please call your pharmacy*  Lab Work: Your provider would like for you to have following labs drawn today CBC.     Testing/Procedures: No test ordered today   Follow-Up: At [facility], you and your health needs are our priority.  As part of our continuing mission to provide you with exceptional heart care, our providers are all part of one team.  This team includes your primary Cardiologist (physician) and Advanced Practice Providers or APPs (Physician Assistants and Nurse Practitioners) who all work together to provide you with the care you need, when you need it.  Your next appointment:   1 year(s)  Provider:   Suzann Riddle, NP

## 2024-03-16 ENCOUNTER — Ambulatory Visit: Payer: Self-pay | Admitting: Cardiology

## 2024-03-16 LAB — CBC
Hematocrit: 46.3 % (ref 37.5–51.0)
Hemoglobin: 15.3 g/dL (ref 13.0–17.7)
MCH: 33 pg (ref 26.6–33.0)
MCHC: 33 g/dL (ref 31.5–35.7)
MCV: 100 fL — ABNORMAL HIGH (ref 79–97)
Platelets: 308 x10E3/uL (ref 150–450)
RBC: 4.64 x10E6/uL (ref 4.14–5.80)
RDW: 12.1 % (ref 11.6–15.4)
WBC: 7.6 x10E3/uL (ref 3.4–10.8)

## 2024-03-18 NOTE — Progress Notes (Signed)
 Remote PPM Transmission

## 2024-03-19 ENCOUNTER — Ambulatory Visit: Payer: Self-pay | Admitting: Cardiology
